# Patient Record
Sex: Female | Born: 2000 | Race: Black or African American | Hispanic: No | Marital: Single | Smoking: Never smoker
Health system: Southern US, Community
[De-identification: ages and names within clinical notes are randomized; demographics above are authoritative.]

## PROBLEM LIST (undated history)

## (undated) DIAGNOSIS — Z789 Other specified health status: Secondary | ICD-10-CM

## (undated) HISTORY — PX: NO PAST SURGERIES: SHX2092

---
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
## 2019-04-26 DIAGNOSIS — Z3400 Encounter for supervision of normal first pregnancy, unspecified trimester: Secondary | ICD-10-CM | POA: Insufficient documentation

## 2019-04-26 HISTORY — DX: Encounter for supervision of normal first pregnancy, unspecified trimester: Z34.00

## 2019-04-26 LAB — OB RESULTS CONSOLE RUBELLA ANTIBODY, IGM: Rubella: IMMUNE

## 2019-04-26 LAB — OB RESULTS CONSOLE ANTIBODY SCREEN: Antibody Screen: NEGATIVE

## 2019-04-26 LAB — OB RESULTS CONSOLE GC/CHLAMYDIA
Chlamydia: NEGATIVE
Gonorrhea: NEGATIVE

## 2019-04-26 LAB — OB RESULTS CONSOLE ABO/RH: RH Type: POSITIVE

## 2019-04-26 LAB — OB RESULTS CONSOLE HEPATITIS B SURFACE ANTIGEN: Hepatitis B Surface Ag: NEGATIVE

## 2019-04-26 LAB — OB RESULTS CONSOLE HIV ANTIBODY (ROUTINE TESTING): HIV: NONREACTIVE

## 2019-04-26 LAB — OB RESULTS CONSOLE RPR: RPR: NONREACTIVE

## 2019-08-29 DIAGNOSIS — Z3A26 26 weeks gestation of pregnancy: Secondary | ICD-10-CM | POA: Diagnosis not present

## 2019-08-29 DIAGNOSIS — U071 COVID-19: Secondary | ICD-10-CM | POA: Diagnosis not present

## 2019-08-29 DIAGNOSIS — O98512 Other viral diseases complicating pregnancy, second trimester: Secondary | ICD-10-CM | POA: Diagnosis not present

## 2019-09-19 DIAGNOSIS — Z3403 Encounter for supervision of normal first pregnancy, third trimester: Secondary | ICD-10-CM | POA: Diagnosis not present

## 2019-09-19 DIAGNOSIS — Z131 Encounter for screening for diabetes mellitus: Secondary | ICD-10-CM | POA: Diagnosis not present

## 2019-09-19 DIAGNOSIS — Z113 Encounter for screening for infections with a predominantly sexual mode of transmission: Secondary | ICD-10-CM | POA: Diagnosis not present

## 2019-10-24 DIAGNOSIS — B342 Coronavirus infection, unspecified: Secondary | ICD-10-CM | POA: Insufficient documentation

## 2019-10-26 ENCOUNTER — Inpatient Hospital Stay (HOSPITAL_COMMUNITY)
Admission: AD | Admit: 2019-10-26 | Discharge: 2019-10-26 | Disposition: A | Discharge status: Home / Self Care | Payer: Managed Care, Other (non HMO) | Attending: Obstetrics & Gynecology | Admitting: Obstetrics & Gynecology

## 2019-10-26 ENCOUNTER — Other Ambulatory Visit: Payer: Self-pay

## 2019-10-26 ENCOUNTER — Encounter (HOSPITAL_COMMUNITY): Payer: Self-pay | Admitting: *Deleted

## 2019-10-26 DIAGNOSIS — O26893 Other specified pregnancy related conditions, third trimester: Secondary | ICD-10-CM

## 2019-10-26 DIAGNOSIS — Z3A34 34 weeks gestation of pregnancy: Secondary | ICD-10-CM | POA: Insufficient documentation

## 2019-10-26 DIAGNOSIS — R110 Nausea: Secondary | ICD-10-CM | POA: Diagnosis not present

## 2019-10-26 DIAGNOSIS — R109 Unspecified abdominal pain: Secondary | ICD-10-CM | POA: Insufficient documentation

## 2019-10-26 HISTORY — DX: Other specified health status: Z78.9

## 2019-10-26 LAB — CBC WITH DIFFERENTIAL/PLATELET
Abs Immature Granulocytes: 0.08 10*3/uL — ABNORMAL HIGH (ref 0.00–0.07)
Basophils Absolute: 0 10*3/uL (ref 0.0–0.1)
Basophils Relative: 0 %
Eosinophils Absolute: 0.1 10*3/uL (ref 0.0–0.5)
Eosinophils Relative: 1 %
HCT: 32.5 % — ABNORMAL LOW (ref 36.0–46.0)
Hemoglobin: 11.3 g/dL — ABNORMAL LOW (ref 12.0–15.0)
Immature Granulocytes: 1 %
Lymphocytes Relative: 20 %
Lymphs Abs: 1.9 10*3/uL (ref 0.7–4.0)
MCH: 31 pg (ref 26.0–34.0)
MCHC: 34.8 g/dL (ref 30.0–36.0)
MCV: 89.3 fL (ref 80.0–100.0)
Monocytes Absolute: 0.6 10*3/uL (ref 0.1–1.0)
Monocytes Relative: 6 %
Neutro Abs: 6.8 10*3/uL (ref 1.7–7.7)
Neutrophils Relative %: 72 %
Platelets: 207 10*3/uL (ref 150–400)
RBC: 3.64 MIL/uL — ABNORMAL LOW (ref 3.87–5.11)
RDW: 14.1 % (ref 11.5–15.5)
WBC: 9.5 10*3/uL (ref 4.0–10.5)
nRBC: 0 % (ref 0.0–0.2)

## 2019-10-26 LAB — COMPREHENSIVE METABOLIC PANEL
ALT: 10 U/L (ref 0–44)
AST: 15 U/L (ref 15–41)
Albumin: 2.7 g/dL — ABNORMAL LOW (ref 3.5–5.0)
Alkaline Phosphatase: 102 U/L (ref 38–126)
Anion gap: 7 (ref 5–15)
BUN: <5 mg/dL — ABNORMAL LOW (ref 6–20)
CO2: 25 mmol/L (ref 22–32)
Calcium: 8.8 mg/dL — ABNORMAL LOW (ref 8.9–10.3)
Chloride: 106 mmol/L (ref 98–111)
Creatinine, Ser: 0.75 mg/dL (ref 0.44–1.00)
GFR calc Af Amer: >60 mL/min (ref >60)
GFR calc non Af Amer: >60 mL/min (ref >60)
Glucose, Bld: 97 mg/dL (ref 70–99)
Potassium: 4 mmol/L (ref 3.5–5.1)
Sodium: 138 mmol/L (ref 135–145)
Total Bilirubin: 0.5 mg/dL (ref 0.3–1.2)
Total Protein: 5.7 g/dL — ABNORMAL LOW (ref 6.5–8.1)

## 2019-10-26 LAB — URINALYSIS, ROUTINE W REFLEX MICROSCOPIC
Bilirubin Urine: NEGATIVE
Glucose, UA: NEGATIVE mg/dL
Hgb urine dipstick: NEGATIVE
Ketones, ur: NEGATIVE mg/dL
Nitrite: NEGATIVE
Protein, ur: NEGATIVE mg/dL
Specific Gravity, Urine: 1.01 (ref 1.005–1.030)
pH: 8 (ref 5.0–8.0)

## 2019-10-26 LAB — AMYLASE: Amylase: 86 U/L (ref 28–100)

## 2019-10-26 LAB — LIPASE, BLOOD: Lipase: 20 U/L (ref 11–51)

## 2019-10-26 NOTE — MAU Note (Signed)
Pt reports s stabbing pain in her mid right abd that started around 1630. Pain is not as intense now but has had  afew times it got stronger since. Denies any vag bleeding or discharge. Reports decreased fetal movement today as well 34 weeks. Just moved from [place].

## 2019-10-26 NOTE — Discharge Instructions (Signed)
[facility]    [facility]       Phone: [phone]  [facility]   Phone: [phone]  [facility] [place]  Phone: [phone]  [facility]  Phone: [phone]  [facility] [place]  Phone: [phone]  [facility]   Phone: [phone]  [facility]       Phone: [phone]  [facility]    Phone: [phone]   [facility]    Phone: [phone]  [facility]    Phone: [phone]  [facility]    Phone: [phone]  [facility]       Phone: [phone]   [facility]  Phone: [phone]  [facility]    Phone: [phone]  [facility]   Phone: [phone]  [facility]      Phone: [phone]  [facility]    Phone: [phone]    Abdominal Pain During Pregnancy  Abdominal pain is common during pregnancy, and has many possible causes. Some causes are more serious than others, and sometimes the cause is not known. Abdominal pain can be a sign that labor is starting. It can also be caused by normal growth and stretching of muscles and ligaments during pregnancy. Always tell your health care provider if you have any abdominal pain. Follow these instructions at home:  Do not have sex or put anything in your vagina until your pain goes away completely.  Get plenty of rest until your pain improves.  Drink enough fluid to keep your urine pale yellow.  Take over-the-counter and prescription medicines only as told by your health care provider.  Keep all follow-up visits as told by your health care provider. This is  important. Contact a health care provider if:  Your pain continues or gets worse after resting.  You have lower abdominal pain that: ? Comes and goes at regular intervals. ? Spreads to your back. ? Is similar to menstrual cramps.  You have pain or burning when you urinate. Get help right away if:  You have a fever or chills.  You have vaginal bleeding.  You are leaking fluid from your vagina.  You are passing tissue from your vagina.  You have vomiting or diarrhea that lasts for more than 24 hours.  Your baby is moving less than usual.  You feel very weak or faint.  You have shortness of breath.  You develop severe pain in your upper abdomen. Summary  Abdominal pain is common during pregnancy, and has many possible causes.  If you experience abdominal pain during pregnancy, tell your health care provider right away.  Follow your health care provider's home care instructions and keep all follow-up visits as directed. This information is not intended to replace advice given to you by your health care provider. Make sure you discuss any questions you have with your health care provider. Document Released: 11/23/2005 Document Revised: 03/13/2019 Document Reviewed: 02/25/2017 Elsevier Patient Education  2020 Elsevier Inc.    [employer] CorporationBraxton Hicks Contractions Contractions of the uterus can occur throughout pregnancy, but they are not always a sign that you are in labor. You may have practice contractions called Braxton Hicks contractions. These false labor contractions are sometimes confused with true labor. What are Deborah PeltonBraxton Hicks contractions?  Braxton Hicks contractions are tightening movements that occur in the muscles of the uterus before labor. Unlike true labor contractions, these contractions do not result in opening (dilation) and thinning of the cervix. Toward the end of pregnancy (32-34 weeks), Braxton Hicks contractions can happen more often and may become stronger. These  contractions are sometimes difficult to tell apart from true labor because they can be very uncomfortable. You should not feel embarrassed if you go to the hospital with false labor. Sometimes, the only way to tell if you are in true labor is for your health care provider to look for changes in the cervix. The health care provider will do a physical exam and may monitor your contractions. If you are not in true labor, the exam should show that your cervix is not dilating and your water has not broken. If there are no other health problems associated with your pregnancy, it is completely safe for you to be sent home with false labor. You may continue to have Braxton Hicks contractions until you go into true labor. How to tell the difference between true labor and false labor True labor  Contractions last 30-70 seconds.  Contractions become very regular.  Discomfort is usually felt in the top of the uterus, and it spreads to the lower abdomen and low back.  Contractions do not go away with walking.  Contractions usually become more intense and increase in frequency.  The cervix dilates and gets thinner. False labor  Contractions are usually shorter and not as strong as true labor contractions.  Contractions are usually irregular.  Contractions are often felt in the front of the lower abdomen and in the groin.  Contractions may go away when you walk around or change positions while lying down.  Contractions get weaker and are shorter-lasting as time goes on.  The cervix usually does not dilate or become thin. Follow these instructions at home:   Take over-the-counter and prescription medicines only as told by your health care provider.  Keep up with your usual exercises and follow other instructions from your health care provider.  Eat and drink lightly if you think you are going into labor.  If Braxton Hicks contractions are making you uncomfortable: ? Change your position from  lying down or resting to walking, or change from walking to resting. ? Sit and rest in a tub of warm water. ? Drink enough fluid to keep your urine pale yellow. Dehydration may cause these contractions. ? Do slow and deep breathing several times an hour.  Keep all follow-up prenatal visits as told by your health care provider. This is important. Contact a health care provider if:  You have a fever.  You have continuous pain in your abdomen. Get help right away if:  Your contractions become stronger, more regular, and closer together.  You have fluid leaking or gushing from your vagina.  You pass blood-tinged mucus (bloody show).  You have bleeding from your vagina.  You have low back pain that you never had before.  You feel your babys head pushing down and causing pelvic pressure.  Your baby is not moving inside you as much as it used to. Summary  Contractions that occur before labor are called Braxton Hicks contractions, false labor, or practice contractions.  Braxton Hicks contractions are usually shorter, weaker, farther apart, and less regular than true labor contractions. True labor contractions usually become progressively stronger and regular, and they become more frequent.  Manage discomfort from [facility]  contractions by changing position, resting in a warm bath, drinking plenty of water, or practicing deep breathing. This information is not intended to replace advice given to you by your health care provider. Make sure you discuss any questions you have with your health care provider. Document Released: 04/08/2017 Document Revised: 11/05/2017 Document Reviewed: 04/08/2017 Elsevier Patient Education  2020 [employer].

## 2019-10-26 NOTE — MAU Provider Note (Addendum)
History     CSN: [id number]  Arrival date and time: 10/26/19 1708   None     Chief Complaint  Patient presents with  . Abdominal Pain   HPI   Deborah Tate is a 18 y.o. G1P0 @ [redacted]w[redacted]d, here for stabbing right-sided abdominal pain that started this afternoon at around 16:30. Patient reports the pain initially started at RLQ and moved up to RUQ. Pain does not radiate and is constant at 3/10 severity but intensifies intermittently to 7/10 severity. It worsens with standing up and alleviated when she bends over.   Patient also reports associated mild nausea without vomiting. Patient also reports decreased fetal movement today and has felt about 4-5 times today in total.  Last intercourse two days ago. Last bowel movement yesterday. Patient denies any recent trauma.   Patient denies : fever, chest pain, edema, urinary symptoms, diarrhea, constipation, vaginal bleeding, vaginal discharge   Past Medical History:  Diagnosis Date  . Medical history non-contributory     Past Surgical History:  Procedure Laterality Date  . NO PAST SURGERIES      No family history on file.  Social History   Tobacco Use  . Smoking status: Not on file  Substance Use Topics  . Alcohol use: Not on file  . Drug use: Not on file    Allergies: No Known Allergies  No medications prior to admission.    Review of Systems  Constitutional: Negative for fatigue and fever.  Cardiovascular: Negative.   Gastrointestinal: Positive for abdominal pain (RUQ) and nausea. Negative for abdominal distention, constipation, diarrhea and vomiting.  Genitourinary: Negative for dysuria, flank pain, pelvic pain, vaginal bleeding, vaginal discharge and vaginal pain.  Neurological: Negative for dizziness.   Physical Exam   Blood pressure 104/60, pulse 85, temperature 98.3 F (36.8 C), temperature source Oral, resp. rate 18, height 5\' 2"  (1.575 m), weight 72.6 kg.  Physical Exam  Nursing note and vitals  reviewed. Constitutional: She is oriented to person, place, and time. She appears well-developed and well-nourished. No distress.  Cardiovascular: Normal rate.  Respiratory: Effort normal.  GI: Soft.  Neurological: She is alert and oriented to person, place, and time.  Skin: Skin is warm and dry. No erythema.  Psychiatric: She has a normal mood and affect.   FHT : 150bpm baseline, moderate variability, +accel, -decel  TOCO : no activity today  MAU Course   Results for orders placed or performed during the hospital encounter of 10/26/19 (from the past 24 hour(s))  Urinalysis, Routine w reflex microscopic     Status: Abnormal   Collection Time: 10/26/19  5:59 PM  Result Value Ref Range   Color, Urine YELLOW YELLOW   APPearance CLEAR CLEAR   Specific Gravity, Urine 1.010 1.005 - 1.030   pH 8.0 5.0 - 8.0   Glucose, UA NEGATIVE NEGATIVE mg/dL   Hgb urine dipstick NEGATIVE NEGATIVE   Bilirubin Urine NEGATIVE NEGATIVE   Ketones, ur NEGATIVE NEGATIVE mg/dL   Protein, ur NEGATIVE NEGATIVE mg/dL   Nitrite NEGATIVE NEGATIVE   Leukocytes,Ua SMALL (A) NEGATIVE   RBC / HPF 0-5 0 - 5 RBC/hpf   WBC, UA 0-5 0 - 5 WBC/hpf   Bacteria, UA RARE (A) NONE SEEN   Squamous Epithelial / LPF 0-5 0 - 5   Mucus PRESENT   CBC with Differential     Status: Abnormal   Collection Time: 10/26/19  7:01 PM  Result Value Ref Range   WBC 9.5 4.0 - 10.5 K/uL  RBC 3.64 (L) 3.87 - 5.11 MIL/uL   Hemoglobin 11.3 (L) 12.0 - 15.0 g/dL   HCT 32.5 (L) 36.0 - 46.0 %   MCV 89.3 80.0 - 100.0 fL   MCH 31.0 26.0 - 34.0 pg   MCHC 34.8 30.0 - 36.0 g/dL   RDW 14.1 11.5 - 15.5 %   Platelets 207 150 - 400 K/uL   nRBC 0.0 0.0 - 0.2 %   Neutrophils Relative % 72 %   Neutro Abs 6.8 1.7 - 7.7 K/uL   Lymphocytes Relative 20 %   Lymphs Abs 1.9 0.7 - 4.0 K/uL   Monocytes Relative 6 %   Monocytes Absolute 0.6 0.1 - 1.0 K/uL   Eosinophils Relative 1 %   Eosinophils Absolute 0.1 0.0 - 0.5 K/uL   Basophils Relative 0 %    Basophils Absolute 0.0 0.0 - 0.1 K/uL   Immature Granulocytes 1 %   Abs Immature Granulocytes 0.08 (H) 0.00 - 0.07 K/uL  CMET STAT     Status: Abnormal   Collection Time: 10/26/19  7:01 PM  Result Value Ref Range   Sodium 138 135 - 145 mmol/L   Potassium 4.0 3.5 - 5.1 mmol/L   Chloride 106 98 - 111 mmol/L   CO2 25 22 - 32 mmol/L   Glucose, Bld 97 70 - 99 mg/dL   BUN <5 (L) 6 - 20 mg/dL   Creatinine, Ser 0.75 0.44 - 1.00 mg/dL   Calcium 8.8 (L) 8.9 - 10.3 mg/dL   Total Protein 5.7 (L) 6.5 - 8.1 g/dL   Albumin 2.7 (L) 3.5 - 5.0 g/dL   AST 15 15 - 41 U/L   ALT 10 0 - 44 U/L   Alkaline Phosphatase 102 38 - 126 U/L   Total Bilirubin 0.5 0.3 - 1.2 mg/dL   GFR calc non Af Amer >60 >60 mL/min   GFR calc Af Amer >60 >60 mL/min   Anion gap 7 5 - 15  Lipase STAT     Status: None   Collection Time: 10/26/19  7:01 PM  Result Value Ref Range   Lipase 20 11 - 51 U/L  Amylase     Status: None   Collection Time: 10/26/19  7:01 PM  Result Value Ref Range   Amylase 86 28 - 100 U/L   MDM  Assessment and Plan   1. Abdominal pain during pregnancy in third trimester - WBC, lipase, amylase, UA, and CMET WNL.  - Possibly braxton hicks vs. Musculoskeletal especially as the pain is positional per patient. Educational counseling given regarding symptoms and signs of Braxton Hicks vs. true labor contractions.    2. Encouraged patient to establish prenatal care nearby - Patient moved to [place] from [place] at the end of October. She has been driving back to [facility] for her prenatal care since and last care visit was this two days ago. Next [place] office visit scheduled for Dec 3rd. - Encourage the patient to keep the Dec 3rd visit and a list of OB outpatient offices in [place] given.     Hessie Dibble 10/26/2019, 8:30 PM   I was performed the exam and agree with above. No evidence of PTL, appendicitis, cholecystitis, pancreatitis or other emergent condition.  Tamala Julian, [place],  [place] 10/26/2019 11:50 PM

## 2019-11-15 ENCOUNTER — Telehealth: Payer: Self-pay | Admitting: *Deleted

## 2019-11-15 NOTE — Telephone Encounter (Signed)
Left patient a message to call or [facility] message the answers to screening questions prior to appointment on 11/16/19 at 1:15 PM with an 1:00 PM arrival.

## 2019-11-16 ENCOUNTER — Other Ambulatory Visit: Payer: Self-pay

## 2019-11-16 ENCOUNTER — Inpatient Hospital Stay (HOSPITAL_COMMUNITY)
Admission: AD | Admit: 2019-11-16 | Discharge: 2019-11-16 | Disposition: A | Discharge status: Home / Self Care | Payer: Managed Care, Other (non HMO) | Attending: Obstetrics and Gynecology | Admitting: Obstetrics and Gynecology

## 2019-11-16 ENCOUNTER — Other Ambulatory Visit (HOSPITAL_COMMUNITY)
Admission: RE | Admit: 2019-11-16 | Discharge: 2019-11-16 | Disposition: A | Discharge status: Home / Self Care | Payer: Managed Care, Other (non HMO) | Source: Ambulatory Visit | Attending: Obstetrics & Gynecology | Admitting: Obstetrics & Gynecology

## 2019-11-16 ENCOUNTER — Encounter (HOSPITAL_COMMUNITY): Payer: Self-pay | Admitting: Obstetrics and Gynecology

## 2019-11-16 ENCOUNTER — Encounter: Payer: Self-pay | Admitting: Obstetrics & Gynecology

## 2019-11-16 ENCOUNTER — Ambulatory Visit (INDEPENDENT_AMBULATORY_CARE_PROVIDER_SITE_OTHER): Payer: Managed Care, Other (non HMO) | Admitting: Obstetrics & Gynecology

## 2019-11-16 VITALS — BP 99/66 | HR 95 | Temp 98.2°F | Wt 162.0 lb

## 2019-11-16 DIAGNOSIS — Z3A37 37 weeks gestation of pregnancy: Secondary | ICD-10-CM

## 2019-11-16 DIAGNOSIS — Z0371 Encounter for suspected problem with amniotic cavity and membrane ruled out: Secondary | ICD-10-CM | POA: Diagnosis not present

## 2019-11-16 DIAGNOSIS — O26843 Uterine size-date discrepancy, third trimester: Secondary | ICD-10-CM

## 2019-11-16 DIAGNOSIS — Z23 Encounter for immunization: Secondary | ICD-10-CM

## 2019-11-16 DIAGNOSIS — O368130 Decreased fetal movements, third trimester, not applicable or unspecified: Secondary | ICD-10-CM | POA: Diagnosis not present

## 2019-11-16 DIAGNOSIS — Z87891 Personal history of nicotine dependence: Secondary | ICD-10-CM | POA: Diagnosis not present

## 2019-11-16 DIAGNOSIS — Z3400 Encounter for supervision of normal first pregnancy, unspecified trimester: Secondary | ICD-10-CM

## 2019-11-16 DIAGNOSIS — O288 Other abnormal findings on antenatal screening of mother: Secondary | ICD-10-CM | POA: Insufficient documentation

## 2019-11-16 LAB — AMNISURE RUPTURE OF MEMBRANE (ROM) NOT AT ARMC: Amnisure ROM: NEGATIVE

## 2019-11-16 LAB — URINALYSIS, ROUTINE W REFLEX MICROSCOPIC
Bilirubin Urine: NEGATIVE
Glucose, UA: NEGATIVE mg/dL
Hgb urine dipstick: NEGATIVE
Ketones, ur: NEGATIVE mg/dL
Nitrite: NEGATIVE
Protein, ur: NEGATIVE mg/dL
Specific Gravity, Urine: 1.004 — ABNORMAL LOW (ref 1.005–1.030)
pH: 7 (ref 5.0–8.0)

## 2019-11-16 LAB — POCT FERN TEST: POCT Fern Test: NEGATIVE

## 2019-11-16 LAB — OB RESULTS CONSOLE GBS: GBS: NEGATIVE

## 2019-11-16 NOTE — Discharge Instructions (Signed)
Oligohydramnios Oligohydramnios is a condition in which there is not enough fluid in the sac (amniotic sac) that surrounds your unborn baby (fetus) in the uterus. The amniotic sac contains fluid (amniotic fluid) that:  Protects your baby from injury (trauma) and infections.  Helps your baby move freely inside the uterus.  Helps your baby's lungs, kidneys, and digestive system to develop. This condition can interfere with your baby's normal prenatal growth and development. It can happen any time during pregnancy but is most common during the last 3 months (third trimester). Oligohydramnios is most likely to cause serious problems when it occurs early in pregnancy. Some problems that can result from this condition include:  Early (premature) birth.  Birth defects.  Limited (restricted) fetal growth.  Decreased oxygen flow to the fetus due to pressure on the umbilical cord.  Pregnancy loss (miscarriage).  Stillbirth. What are the causes? This condition may be caused by:  A leak or tear in the amniotic sac.  A problem with the organ that nourishes the baby in the uterus (placenta), such as failure of the placenta to provide enough blood, fluid, and nutrients to the baby.  Having identical twins who share the same placenta.  A fetal birth defect. This is usually an abnormality in the fetal kidneys or urinary tract.  A pregnancy that goes 2 weeks or more past the due date.  A condition that the mother has, such as: ? A lack of fluids in the body (dehydration). ? High blood pressure. ? Diabetes. ? Reactions to certain medicines, such as ibuprofen or blood pressure medicines (ACE inhibitors). ? Systemic lupus. In some cases, the cause is not known. What increases the risk? This condition is more likely to develop if you:  Become dehydrated.  Have high blood pressure.  Take NSAIDs or ACE inhibitors.  Have diabetes or lupus.  Have poor prenatal care.  Have a clotting  disorder. What are the signs or symptoms? In most cases, there are no symptoms of oligohydramnios. If you do have symptoms, they may include:  Fluid leaking from the vagina.  Having a uterus that is smaller than normal.  Feeling less movement of your baby in your uterus. How is this diagnosed? This condition may be diagnosed by measuring the amount of amniotic fluid in your amniotic sac using the amniotic fluid index (AFI). The AFI is a measurement that is done during a prenatal ultrasound test that uses painless, harmless sound waves to create an image of your uterus and your baby. This prenatal ultrasound may be done to:  Measure the amniotic fluid level.  Check your baby's kidneys and your baby's growth.  Evaluate the placenta. You may also have other tests to find the cause of oligohydramnios. How is this treated? Treatment for this condition depends on how low your amniotic fluid level is, how far along you are in your pregnancy, and your overall health. Treatment may include:  Having your health care provider monitor your condition more closely than usual. You may have more frequent appointments and more AFI ultrasound measurements.  Increasing the amount of fluid in your body. This may be done by having you drink more fluids or by giving you fluids through an IV that is inserted into one of your veins.  Injecting fluid into your amniotic sac during delivery (amnioinfusion).  Having your baby delivered early, if you are close to your due date. Follow these instructions at home: Lifestyle  Do not drink alcohol. No amount of alcohol is  safe during pregnancy.  Do not use any products that contain nicotine or tobacco, such as cigarettes, e-cigarettes, and chewing tobacco. If you need help quitting, ask your health care provider.  Do not use any illegal drugs. These can harm your developing baby or cause a miscarriage. General instructions  Take over-the-counter and  prescription medicines only as told by your health care provider.  Follow instructions from your health care provider about physical activity and rest. Your health care provider may recommend that you stay in bed (be on bed rest).  Follow instructions from your health care provider about eating or drinking restrictions.  Eat healthy foods, including a balance of fruits and vegetables, lean proteins, whole grains, and low-fat or nonfat dairy products.  Drink enough fluid to keep your urine pale yellow.  Keep all prenatal care appointments with your health care provider. This is important. Contact a health care provider if:  You notice that your baby seems to be moving less than usual. Get help right away if:  You have fluid leaking from your vagina.  You start to have labor pains (contractions). This may feel like a sense of tightening in your lower abdomen.  You have a fever. Summary  Oligohydramnios is a condition in which there is not enough fluid in the amniotic sac that surrounds your unborn baby in the uterus. It can interfere with your baby's normal growth and development.  This condition is most common in the last 3 months of pregnancy but can occur at any time. If it occurs early in pregnancy, oligohydramnios can lead to serious problems.  Do not drink alcohol or use nicotine, tobacco, or illegal drugs.  Keep your regular prenatal appointments, eat healthy, and drink enough fluid to keep your urine pale yellow.  Contact your health care provider if you notice that your baby seems to be moving less than usual. Get help right away if you have fluid leaking from your vagina, you sense a tightening in your lower abdomen, or you have a fever. This information is not intended to replace advice given to you by your health care provider. Make sure you discuss any questions you have with your health care provider. Document Released: 03/10/2011 Document Revised: 05/04/2018 Document  Reviewed: 05/04/2018 Elsevier Patient Education  2020 [employer].

## 2019-11-16 NOTE — MAU Note (Signed)
.   Deborah Tate is a 18 y.o. at [redacted]w[redacted]d here in MAU reporting: that she was sent over for monitoring after U/S in office. Reports upper abdominal pain at times. +FM  Onset of complaint: in office today Pain score: 4 Vitals:   11/16/19 1523  BP: (!) 112/58  Pulse: 82  Resp: 18  Temp: 97.9 F (36.6 C)  SpO2: 100%     FHT:166 Lab orders placed from triage:UA

## 2019-11-16 NOTE — MAU Provider Note (Addendum)
History      CSN: [id number]  Arrival date and time: 11/16/19 1507   First Provider Initiated Contact with Patient 11/16/19 1617      Chief Complaint  Patient presents with  . monitoring   Deborah Tate is a 18 y.o. G1P0 at [redacted]w[redacted]d who was sent to MAU from the office for rule out rupture of membranes. She had a size-date discrepancy. AFI was 8.1, grade 4 placenta. Reports continuous clear vaginal discharge x1 week with increase in amount of discharge noted in the past 2 days. Patient denies vaginal bleeding, abdominal pain, decreased fetal movement.   OB History    Gravida  1   Para      Term      Preterm      AB      Living        SAB      TAB      Ectopic      Multiple      Live Births              Past Medical History:  Diagnosis Date  . Medical history non-contributory     Past Surgical History:  Procedure Laterality Date  . NO PAST SURGERIES      Family History  Problem Relation Age of Onset  . Gestational diabetes Mother   . Diabetes Maternal Grandmother   . Lung disease Maternal Grandfather     Social History   Tobacco Use  . Smoking status: Former Smoker    Quit date: 11/15/2018    Years since quitting: 1.0  . Smokeless tobacco: Never Used  Substance Use Topics  . Alcohol use: Never  . Drug use: Not Currently    Types: Marijuana    Allergies: No Known Allergies  Medications Prior to Admission  Medication Sig Dispense Refill Last Dose  . Prenatal Vit-Fe Fumarate-FA (PNV FOLIC ACID + IRON) 27-1 MG TABS Take by mouth.   11/15/2019 at Unknown time    Review of Systems  Constitutional: Negative for chills and fever.  Gastrointestinal: Negative for abdominal distention and abdominal pain.  Genitourinary: Positive for vaginal discharge. Negative for pelvic pain, vaginal bleeding and vaginal pain.  All other systems reviewed and are negative.  Physical Exam   Blood pressure (!) 107/57, pulse 89, temperature 98.3 F (36.8 C),  resp. rate 20, SpO2 100 %.  Physical Exam  Constitutional: She is oriented to person, place, and time. She appears well-developed and well-nourished.  Respiratory: Effort normal.  GI: Soft.  Genitourinary:    Vaginal discharge present.     Genitourinary Comments: Pelvic Exam: Normal appearing external female genitalia with clear vaginal discharge noted externally, no abnormal discharge or pooling of fluid during speculum exam. Normal appearing cervix.   Neurological: She is alert and oriented to person, place, and time.  Skin: Skin is warm and dry.  Psychiatric: She has a normal mood and affect. Her behavior is normal.    MAU Course  Procedures  MDM Patient has scant clear vaginal discharge present on external exam without pooling, negative Fern, negative Amnisure. No evidence of ROM. Consulted with Dr. Macon Large regarding AFI and size date discrepancy. Was advised to schedule for an outpatient growth ultrasound and BPP for tomorrow with MFM.  Assessment and Plan   1. AFI (amniotic fluid index) borderline low   2. Uterine size date discrepancy pregnancy, third trimester   3. No leakage of amniotic fluid into vagina    - Growth ultrasound and  BPP scheduled with MFM for tomorrow - Advised patient to call office if have not heard regarding appointment by 9:00am on 11/17/19  - Discussed normal vaginal discharge of pregnancy - Educated on low AFI and provided handout on oligohydramnios - Labor and emergent precautions given - Advised to return as needed to MAU and keep scheduled prenatal visit appointments  [name] 11/16/2019, 7:10 PM   I was present for the exam, verified findings and agree with above.  Tamala Julian, [place], [place] 11/16/2019 7:30 PM

## 2019-11-16 NOTE — Addendum Note (Signed)
Addended by: Lyndal Rainbow on: 11/16/2019 01:47 PM   Modules accepted: Orders

## 2019-11-16 NOTE — Progress Notes (Addendum)
  Subjective:    Deborah Tate is being seen today for her first obstetrical visit.  This is not a planned pregnancy. She is at [redacted]w[redacted]d gestation. Her obstetrical history is significant for teen pregnancy. Relationship with FOB: significant other, living together. Patient does intend to breast feed. Pregnancy history fully reviewed. She tells me that she has had prenatal care in [place]. She moved here in October. She has not had a flu vaccine or GBS cultures. Patient reports decreased fetal movement.  Review of Systems:   Review of Systems  She is a sophomore at [employer].  Objective:     BP 99/66   Pulse 95   Temp 98.2 F (36.8 C)   Wt 162 lb (73.5 kg)   BMI 29.63 kg/m  Physical Exam  Exam Breathing, conversing, and ambulating normally Well nourished, well hydrated Black female, no apparent distress Heart-rrr Lungs- CTAB Abd- gravid, benign FH- 30 ([redacted] weeks EGA) Cervix- closed Relatively narrow pubic arch   Assessment:    Pregnancy: G1P0 Patient Active Problem List   Diagnosis Date Noted  . Coronavirus infection 10/24/2019  . Supervision of normal first pregnancy, antepartum 04/26/2019       Plan:     Initial labs drawn. Prenatal vitamins. Problem list reviewed and updated. Her AFI here is 8.1 and the placenta is Grade 4. I spoke with Dr. Annamaria Boots of MFM In person visit in a week Cervical cultures sent After her exam she gives a h/o some increase in vaginal discharge over the last few days. She will go to the MAU today  Emily Filbert 11/16/2019

## 2019-11-17 ENCOUNTER — Other Ambulatory Visit: Payer: Self-pay | Admitting: Obstetrics & Gynecology

## 2019-11-17 ENCOUNTER — Ambulatory Visit (HOSPITAL_COMMUNITY)
Admission: RE | Admit: 2019-11-17 | Discharge: 2019-11-17 | Disposition: A | Discharge status: Home / Self Care | Payer: Managed Care, Other (non HMO) | Source: Ambulatory Visit | Attending: Obstetrics & Gynecology | Admitting: Obstetrics & Gynecology

## 2019-11-17 DIAGNOSIS — O365930 Maternal care for other known or suspected poor fetal growth, third trimester, not applicable or unspecified: Secondary | ICD-10-CM | POA: Diagnosis not present

## 2019-11-17 DIAGNOSIS — Z363 Encounter for antenatal screening for malformations: Secondary | ICD-10-CM | POA: Diagnosis not present

## 2019-11-17 DIAGNOSIS — O368190 Decreased fetal movements, unspecified trimester, not applicable or unspecified: Secondary | ICD-10-CM

## 2019-11-17 DIAGNOSIS — Z3400 Encounter for supervision of normal first pregnancy, unspecified trimester: Secondary | ICD-10-CM

## 2019-11-17 DIAGNOSIS — O26843 Uterine size-date discrepancy, third trimester: Secondary | ICD-10-CM

## 2019-11-17 DIAGNOSIS — Z3A36 36 weeks gestation of pregnancy: Secondary | ICD-10-CM | POA: Diagnosis not present

## 2019-11-17 DIAGNOSIS — O368130 Decreased fetal movements, third trimester, not applicable or unspecified: Secondary | ICD-10-CM | POA: Diagnosis not present

## 2019-11-17 LAB — CERVICOVAGINAL ANCILLARY ONLY
Chlamydia: NEGATIVE
Comment: NEGATIVE
Comment: NORMAL
Neisseria Gonorrhea: NEGATIVE

## 2019-11-19 LAB — CULTURE, BETA STREP (GROUP B ONLY)
MICRO NUMBER:: 1185850
SPECIMEN QUALITY:: ADEQUATE

## 2019-11-20 ENCOUNTER — Other Ambulatory Visit: Payer: Self-pay

## 2019-11-20 ENCOUNTER — Encounter: Payer: Self-pay | Admitting: Advanced Practice Midwife

## 2019-11-20 ENCOUNTER — Inpatient Hospital Stay (HOSPITAL_COMMUNITY): Payer: Managed Care, Other (non HMO)

## 2019-11-20 ENCOUNTER — Inpatient Hospital Stay (EMERGENCY_DEPARTMENT_HOSPITAL)
Admission: AD | Admit: 2019-11-20 | Discharge: 2019-11-20 | Disposition: A | Discharge status: Home / Self Care | Payer: Managed Care, Other (non HMO) | Source: Home / Self Care | Attending: Emergency Medicine | Admitting: Emergency Medicine

## 2019-11-20 ENCOUNTER — Other Ambulatory Visit: Payer: Self-pay | Admitting: Obstetrics and Gynecology

## 2019-11-20 ENCOUNTER — Encounter (HOSPITAL_COMMUNITY): Payer: Self-pay | Admitting: Obstetrics & Gynecology

## 2019-11-20 DIAGNOSIS — O26893 Other specified pregnancy related conditions, third trimester: Secondary | ICD-10-CM

## 2019-11-20 DIAGNOSIS — R0789 Other chest pain: Secondary | ICD-10-CM | POA: Insufficient documentation

## 2019-11-20 DIAGNOSIS — U071 COVID-19: Secondary | ICD-10-CM | POA: Insufficient documentation

## 2019-11-20 DIAGNOSIS — O365990 Maternal care for other known or suspected poor fetal growth, unspecified trimester, not applicable or unspecified: Secondary | ICD-10-CM | POA: Insufficient documentation

## 2019-11-20 DIAGNOSIS — O99891 Other specified diseases and conditions complicating pregnancy: Secondary | ICD-10-CM | POA: Diagnosis not present

## 2019-11-20 DIAGNOSIS — Z87891 Personal history of nicotine dependence: Secondary | ICD-10-CM | POA: Insufficient documentation

## 2019-11-20 DIAGNOSIS — Z3A37 37 weeks gestation of pregnancy: Secondary | ICD-10-CM | POA: Diagnosis not present

## 2019-11-20 DIAGNOSIS — O365930 Maternal care for other known or suspected poor fetal growth, third trimester, not applicable or unspecified: Secondary | ICD-10-CM | POA: Diagnosis not present

## 2019-11-20 DIAGNOSIS — Z20828 Contact with and (suspected) exposure to other viral communicable diseases: Secondary | ICD-10-CM | POA: Diagnosis not present

## 2019-11-20 HISTORY — DX: Maternal care for other known or suspected poor fetal growth, unspecified trimester, not applicable or unspecified: O36.5990

## 2019-11-20 LAB — BASIC METABOLIC PANEL
Anion gap: 9 (ref 5–15)
BUN: <5 mg/dL — ABNORMAL LOW (ref 6–20)
CO2: 23 mmol/L (ref 22–32)
Calcium: 8.7 mg/dL — ABNORMAL LOW (ref 8.9–10.3)
Chloride: 103 mmol/L (ref 98–111)
Creatinine, Ser: 0.71 mg/dL (ref 0.44–1.00)
GFR calc Af Amer: >60 mL/min (ref >60)
GFR calc non Af Amer: >60 mL/min (ref >60)
Glucose, Bld: 91 mg/dL (ref 70–99)
Potassium: 3.9 mmol/L (ref 3.5–5.1)
Sodium: 135 mmol/L (ref 135–145)

## 2019-11-20 LAB — CBC
HCT: 34 % — ABNORMAL LOW (ref 36.0–46.0)
Hemoglobin: 11.8 g/dL — ABNORMAL LOW (ref 12.0–15.0)
MCH: 31.3 pg (ref 26.0–34.0)
MCHC: 34.7 g/dL (ref 30.0–36.0)
MCV: 90.2 fL (ref 80.0–100.0)
Platelets: 236 10*3/uL (ref 150–400)
RBC: 3.77 MIL/uL — ABNORMAL LOW (ref 3.87–5.11)
RDW: 13.9 % (ref 11.5–15.5)
WBC: 8.6 10*3/uL (ref 4.0–10.5)
nRBC: 0 % (ref 0.0–0.2)

## 2019-11-20 LAB — I-STAT BETA HCG BLOOD, ED (MC, WL, AP ONLY): I-stat hCG, quantitative: >2000 m[IU]/mL — ABNORMAL HIGH (ref <5)

## 2019-11-20 LAB — TROPONIN I (HIGH SENSITIVITY): Troponin I (High Sensitivity): 3 ng/L (ref <18)

## 2019-11-20 MED ORDER — FAMOTIDINE 20 MG PO TABS
20.0000 mg | ORAL_TABLET | Freq: Once | ORAL | Status: AC
  Administered 2019-11-20: 17:00:00 20 mg via ORAL
  Filled 2019-11-20: qty 1

## 2019-11-20 MED ORDER — ACETAMINOPHEN 325 MG PO TABS
650.0000 mg | ORAL_TABLET | Freq: Once | ORAL | Status: AC
  Administered 2019-11-20: 650 mg via ORAL
  Filled 2019-11-20: qty 2

## 2019-11-20 MED ORDER — SODIUM CHLORIDE 0.9% FLUSH: 3.0000 mL | Freq: Once | INTRAVENOUS | Status: DC

## 2019-11-20 NOTE — MAU Note (Signed)
Patient presenting with c/o pain in her left upper chest area starting one and a half weeks ago, intermittent pain, describing as "pressure". Denies medicating for pain. +Fm, no VB, ROM at this time.

## 2019-11-20 NOTE — Discharge Instructions (Signed)
Your work-up today is reassuring, I suspect this pain may be related to reflux you can use Pepcid twice daily before breakfast and dinner.  Call your OB/GYN or return to the ED if symptoms are worsening.  Follow-up for your induction as planned in 2 days.

## 2019-11-20 NOTE — MAU Note (Signed)
Pt is c/o chest pain, tightness and shortness of breath for 2 weeks. Had OB appointment last week but did not tell them. Denies cntrx, VB or LOF. +FM.

## 2019-11-20 NOTE — MAU Provider Note (Signed)
History     CSN: [id number]  Arrival date and time: 11/20/19 1301   None     Chief Complaint  Patient presents with  . Chest Pain   HPI   [name] is a 18 y.o. female G1P0 @ 8129w5d here in MAU with chest pain. The pain is located in the center of her chest and today it is radiating up the left side of her neck to her left ear. The pain comes and goes. She has no SOB. She has no pregnancy complaints. No HA, or [place]. History of IUGR with this pregnancy. She was told by MFM she should deliver by 38 weeks. She has no significant cardiac history or significant cardiac family history.   OB History    Gravida  1   Para      Term      Preterm      AB      Living        SAB      TAB      Ectopic      Multiple      Live Births              Past Medical History:  Diagnosis Date  . Medical history non-contributory     Past Surgical History:  Procedure Laterality Date  . NO PAST SURGERIES      Family History  Problem Relation Age of Onset  . Gestational diabetes Mother   . Diabetes Maternal Grandmother   . Lung disease Maternal Grandfather     Social History   Tobacco Use  . Smoking status: Former Smoker    Quit date: 11/15/2018    Years since quitting: 1.0  . Smokeless tobacco: Never Used  Substance Use Topics  . Alcohol use: Never  . Drug use: Not Currently    Types: Marijuana    Allergies: No Known Allergies  Medications Prior to Admission  Medication Sig Dispense Refill Last Dose  . Prenatal Vit-Fe Fumarate-FA (PNV FOLIC ACID + IRON) 27-1 MG TABS Take by mouth.      Results for orders placed or performed during the hospital encounter of 11/20/19 (from the past 48 hour(s))  Basic metabolic panel     Status: Abnormal   Collection Time: 11/20/19  3:51 PM  Result Value Ref Range   Sodium 135 135 - 145 mmol/L   Potassium 3.9 3.5 - 5.1 mmol/L   Chloride 103 98 - 111 mmol/L   CO2 23 22 - 32 mmol/L   Glucose, Bld 91 70 - 99 mg/dL    BUN <5 (L) 6 - 20 mg/dL   Creatinine, Ser 6.960.71 0.44 - 1.00 mg/dL   Calcium 8.7 (L) 8.9 - 10.3 mg/dL   GFR calc non Af Amer >60 >60 mL/min   GFR calc Af Amer >60 >60 mL/min   Anion gap 9 5 - 15    Comment: Performed at [facility], [address] [place], [place] [id number]  CBC     Status: Abnormal   Collection Time: 11/20/19  3:51 PM  Result Value Ref Range   WBC 8.6 4.0 - 10.5 K/uL   RBC 3.77 (L) 3.87 - 5.11 MIL/uL   Hemoglobin 11.8 (L) 12.0 - 15.0 g/dL   HCT 41.334.0 (L) 24.436.0 - 01.046.0 %   MCV 90.2 80.0 - 100.0 fL   MCH 31.3 26.0 - 34.0 pg   MCHC 34.7 30.0 - 36.0 g/dL   RDW 27.213.9 53.611.5 - 64.415.5 %  Platelets 236 150 - 400 K/uL   nRBC 0.0 0.0 - 0.2 %    Comment: Performed at [facility], [address] [place], [place] [address]  Troponin I (High Sensitivity)     Status: None   Collection Time: 11/20/19  3:51 PM  Result Value Ref Range   Troponin I (High Sensitivity) 3 <18 ng/L    Comment: (NOTE) Elevated high sensitivity troponin I (hsTnI) values and significant  changes across serial measurements may suggest ACS but many other  chronic and acute conditions are known to elevate hsTnI results.  Refer to the Links section for chest pain algorithms and additional  guidance. Performed at [facility], [address], [place], [place] [address]   I-Stat beta hCG blood, ED     Status: Abnormal   Collection Time: 11/20/19  4:03 PM  Result Value Ref Range   I-stat hCG, quantitative >2,000.0 (H) <5 mIU/mL   Comment 3            Comment:   GEST. AGE      CONC.  (mIU/mL)   <=1 WEEK        5 - 50     2 WEEKS       50 - 500     3 WEEKS       100 - 10,000     4 WEEKS     1,000 - 30,000        FEMALE AND NON-PREGNANT FEMALE:     LESS THAN 5 mIU/mL     Review of Systems  Constitutional: Negative for fever.  Respiratory: Negative for shortness of breath.   Cardiovascular: Positive for chest pain.   Physical Exam   Blood pressure (!) 137/56, pulse 94, temperature  98.1 F (36.7 C), temperature source Oral, resp. rate 18, height 5\' 2"  (1.575 m), weight 75.2 kg, SpO2 100 %.   Patient Vitals for the past 24 hrs:  BP Temp Temp src Pulse Resp SpO2 Height Weight  11/20/19 1807 103/68 - - 91 16 100 % - -  11/20/19 1537 111/61 98.5 F (36.9 C) Oral 81 16 100 % - -  11/20/19 1459 115/65 - - 88 18 - - -  11/20/19 1457 - - - - - 100 % - -  11/20/19 1400 (!) 137/56 - - 94 - - - -  11/20/19 1350 115/66 98.1 F (36.7 C) Oral 82 18 100 % 5\' 2"  (1.575 m) 75.2 kg    Physical Exam  Constitutional: She is oriented to person, place, and time. She appears well-developed and well-nourished. No distress.  Cardiovascular: Normal rate and regular rhythm.  Respiratory: Breath sounds normal. No respiratory distress. She has no wheezes. She has no rales.  GI: Soft. She exhibits no distension. There is no abdominal tenderness.  Musculoskeletal:        General: Normal range of motion.  Neurological: She is alert and oriented to person, place, and time.  Skin: Skin is warm. She is not diaphoretic.  Psychiatric: Her behavior is normal.   Fetal Tracing: Baseline: 135 bpm Variability: Moderate  Accelerations: 15x15 Decelerations: None Toco: None  MAU Course  Procedures  None   MDM  EKG WnL  Discussed patient with Dr. 11/22/19 in who agree's patient should be evaluated in the ED d/t chest pain.  Per MFM patient should be delivered by 38 weeks. Reviewed recent which shows IUGR; confirmed induction at 38 weeks with Dr. Trude Mcburney. Induction scheduled for  Wednesday 12/16; Covid swab collected today in MAU. Patient is aware of the plan.  Patient is aware she should quarantine.   Assessment and Plan   A:  1. Atypical chest pain     P:  Transfer to ED for further evaluation of chest pain Induction scheduled on Wednesday. Covid swab collected today Labor precautions discussed.  Fetal kick counts reviewed.     Lezlie Lye, NP 11/20/2019 6:39 PM

## 2019-11-20 NOTE — ED Provider Notes (Signed)
[facility] EMERGENCY DEPARTMENT Provider Note   CSN: [id number] Arrival date & time: 11/20/19  1301     History Chief Complaint  Patient presents with  . Chest Pain    Deborah Tate is a 18 y.o. female.  Deborah Tate is a 18 y.o. female G1P0 at [redacted]w[redacted]d, otherwise healthy, who was sent from the MAU for further evaluation of chest pain.  She states that she has been having intermittent pains over the left side of her chest for the past week.  She states pains lasts anywhere from seconds to a few minutes, no she thinks she has had 2 minutes of constant pain.  She reports it as a pressure like sensation over the left side of her chest.  Pain occasionally radiates up to the top of her chest and neck.  She denies any associated shortness of breath, no dyspnea on exertion.  She denies any lower extremity swelling.  She has had some occasional palpitations.  Denies any lightheadedness or syncope.  No associated abdominal pains, did have some nausea with some episodes of pain.  She has not taken anything to treat the symptoms prior to arrival.  No previous history of cardiac issues.  She has not had any hypertension or preeclampsia during pregnancy.  She does report on intermittent headache across the front of her forehead which she states is mild and not associated with any vision changes, neck stiffness, numbness or weakness.  She has not had any associated fevers or cough and denies any known sick contacts.  No other aggravating or alleviating factors.  The history is provided by the patient and medical records.       Past Medical History:  Diagnosis Date  . Medical history non-contributory     Patient Active Problem List   Diagnosis Date Noted  . Pregnancy affected by fetal growth restriction 11/20/2019  . Coronavirus infection 10/24/2019  . Supervision of normal first pregnancy, antepartum 04/26/2019    Past Surgical History:  Procedure Laterality Date  . NO PAST  SURGERIES       OB History    Gravida  1   Para      Term      Preterm      AB      Living        SAB      TAB      Ectopic      Multiple      Live Births              Family History  Problem Relation Age of Onset  . Gestational diabetes Mother   . Diabetes Maternal Grandmother   . Lung disease Maternal Grandfather     Social History   Tobacco Use  . Smoking status: Former Smoker    Quit date: 11/15/2018    Years since quitting: 1.0  . Smokeless tobacco: Never Used  Substance Use Topics  . Alcohol use: Never  . Drug use: Not Currently    Types: Marijuana    Home Medications Prior to Admission medications   Medication Sig Start Date End Date Taking? Authorizing Provider  Prenatal Vit-Fe Fumarate-FA (PNV FOLIC ACID + IRON) 61-9 MG TABS Take 1 tablet by mouth daily.  04/03/19  Yes [provider]    Allergies    Patient has no known allergies.  Review of Systems   Review of Systems  Constitutional: Negative for chills and fever.  HENT: Negative.   Respiratory: Negative  for cough and shortness of breath.   Cardiovascular: Positive for chest pain.  Gastrointestinal: Negative for abdominal pain, diarrhea, nausea and vomiting.  Musculoskeletal: Negative for arthralgias and myalgias.  Skin: Negative for color change and rash.  Neurological: Negative for syncope and light-headedness.  All other systems reviewed and are negative.   Physical Exam Updated Vital Signs BP 111/61   Pulse 81   Temp 98.5 F (36.9 C) (Oral)   Resp 16   Ht  (1.575 m)   Wt 75.2 kg   SpO2 100%   BMI 30.31 kg/m   Physical Exam Vitals and nursing note reviewed.  Constitutional:      General: She is not in acute distress.    Appearance: She is well-developed. She is not diaphoretic.     Comments: Well-appearing pregnant female in no distress  HENT:     Head: Normocephalic and atraumatic.  Eyes:     General:        Right eye: No discharge.         Left eye: No discharge.     Pupils: Pupils are equal, round, and reactive to light.  Cardiovascular:     Rate and Rhythm: Normal rate and regular rhythm.     Pulses:          Radial pulses are 2+ on the right side and 2+ on the left side.     Heart sounds: Normal heart sounds. No murmur. No friction rub. No gallop.   Pulmonary:     Effort: Pulmonary effort is normal. No respiratory distress.     Breath sounds: Normal breath sounds. No wheezing or rales.     Comments: Respirations equal and unlabored, patient able to speak in full sentences, lungs clear to auscultation bilaterally Chest:     Chest wall: Tenderness present.     Comments: Mild tenderness of left costochondral margin, no skin changes or palpable deformity Abdominal:     General: There is no distension.     Palpations: Abdomen is soft.     Tenderness: There is no abdominal tenderness.     Comments: Gravid abdomen without tenderness  Musculoskeletal:        General: No deformity.     Cervical back: Neck supple.     Right lower leg: No edema.     Left lower leg: No edema.  Skin:    General: Skin is warm and dry.     Capillary Refill: Capillary refill takes less than 2 seconds.  Neurological:     Mental Status: She is alert.     Coordination: Coordination normal.     Comments: Speech is clear, able to follow commands Moves extremities without ataxia, coordination intact  Psychiatric:        Mood and Affect: Mood normal.        Behavior: Behavior normal.     ED Results / Procedures / Treatments   Labs (all labs ordered are listed, but only abnormal results are displayed) Labs Reviewed  BASIC METABOLIC PANEL - Abnormal; Notable for the following components:      Result Value   BUN <5 (*)    Calcium 8.7 (*)    All other components within normal limits  CBC - Abnormal; Notable for the following components:   RBC 3.77 (*)    Hemoglobin 11.8 (*)    HCT 34.0 (*)    All other components within normal limits    I-STAT BETA HCG BLOOD, ED (MC, [facility], AP ONLY) -  Abnormal; Notable for the following components:   I-stat hCG, quantitative >2,000.0 (*)    All other components within normal limits  SARS CORONAVIRUS 2 (TAT 6-24 HRS)  TROPONIN I (HIGH SENSITIVITY)  TROPONIN I (HIGH SENSITIVITY)    EKG EKG Interpretation  Date/Time:  Monday November 20 2019 15:32:57 EST Ventricular Rate:  84 PR Interval:  118 QRS Duration: 84 QT Interval:  354 QTC Calculation: 418 R Axis:   75 Text Interpretation: Normal sinus rhythm with sinus arrhythmia Nonspecific T wave abnormality Abnormal ECG No previous ECGs available Confirmed by Vanetta Mulders [phone]) on 11/20/2019 4:26:44 PM   Radiology No results found.  Procedures Procedures (including critical care time)  Medications Ordered in ED Medications  sodium chloride flush (NS) 0.9 % injection 3 mL (3 mLs Intravenous Not Given 11/20/19 1709)  famotidine (PEPCID) tablet 20 mg (20 mg Oral Given 11/20/19 1710)  acetaminophen (TYLENOL) tablet 650 mg (650 mg Oral Given 11/20/19 1710)    ED Course  I have reviewed the triage vital signs and the nursing notes.  Pertinent labs & imaging results that were available during my care of the patient were reviewed by me and considered in my medical decision making (see chart for details).  Clinical Course as of Nov 19 2201  Mon Nov 20, 2019  1650 Patient presents with intermittent left-sided chest pains over the past week described as pressure.  They last about 2 minutes at most and then seemed to resolve.  She has had some occasional palpitations.  She was sent over from MAU for further evaluation of the symptoms.  On arrival she has normal vitals and is well-appearing.   [KF]  1700 EKG without concerning ischemic changes  EKG 12-Lead [KF]  1700 Stable anemia of pregnancy, no leukocytosis  CBC(!) [KF]  1700 No electrolyte derangements and normal renal functions  Basic metabolic panel(!) [KF]  1700 Patient  refused chest x-ray, she does not want to potentially expose the baby to any radiation   [KF]  1705 No elevation in troponin, and patient is not having any active chest pain  Troponin I (High Sensitivity) [KF]  1740 Reevaluation symptoms have resolved with Pepcid and Tylenol, suspect this pain may have been related to reflux.  Vitals have remained normal throughout ED stay and patient would like to go home.   [KF]    Clinical Course User Index [KF] Legrand Rams   MDM Rules/Calculators/A&P  18 year old female who is [redacted]w[redacted]d scheduled for induction in 2 days, sent over from MAU for evaluation of intermittent left-sided chest pains.  She describes these of brief episodes of pressure that come and go without any associated shortness of breath.  No lower extremity edema.  Patient has not had any tachycardia, hypoxia or tachypnea, and aside from pregnancy has no other risk factors for PE and symptoms seem very atypical, had shared decision-making discussion with the patient and she would like to avoid any radiation.  Patient refused chest x-ray as well.  Her lab work EKG and troponin are all reassuring.  Suspect some of this may be related to GERD.  Story is not concerning for dissection and she has equal pulses.  Symptoms resolved with Tylenol and Pepcid.  She was monitored at MAU with good fetal movement and they had no further concerns regarding pregnancy.  Patient is stable for discharge home, and will follow up in 2 days as planned for scheduled induction.  Turn precautions discussed.  Case discussed with Dr. Deretha Emory who  was available to help guide patient's care and is in agreement with plan.  Final Clinical Impression(s) / ED Diagnoses Final diagnoses:  Atypical chest pain    Rx / DC Orders ED Discharge Orders    None       Legrand Rams 11/20/19 2213    Vanetta Mulders, MD 11/20/19 2317

## 2019-11-20 NOTE — Progress Notes (Signed)
COVID swab performed, pt to be induced Thursday, pt denies any symptoms. Loney Hering nurse ED called to inform that the NP was sending patient to ED for evaluation of chest pain, received by Dr. Peter Congo.

## 2019-11-21 ENCOUNTER — Telehealth (HOSPITAL_COMMUNITY): Payer: Self-pay | Admitting: *Deleted

## 2019-11-21 ENCOUNTER — Telehealth: Payer: Self-pay | Admitting: Advanced Practice Midwife

## 2019-11-21 ENCOUNTER — Other Ambulatory Visit: Payer: Self-pay | Admitting: Women's Health

## 2019-11-21 LAB — SARS CORONAVIRUS 2 (TAT 6-24 HRS): SARS Coronavirus 2: POSITIVE — AB

## 2019-11-21 NOTE — Telephone Encounter (Signed)
Preadmission screen  

## 2019-11-21 NOTE — Telephone Encounter (Signed)
Patient called to discuss impact of COVID + swab on visitor policy during her scheduled induction tomorrow. Patient already in communication with hospital staff and perfectly summarized expectations for remaining in room, limitation on support person, impact on ability to walk around unit and hospital campus.  Mallie Snooks, MSN, CNM Certified Nurse Midwife, [employer] for [employer], [employer] 11/21/19 1:57 PM

## 2019-11-22 ENCOUNTER — Encounter (HOSPITAL_COMMUNITY): Payer: Self-pay | Admitting: Obstetrics and Gynecology

## 2019-11-22 ENCOUNTER — Inpatient Hospital Stay (HOSPITAL_COMMUNITY): Payer: Managed Care, Other (non HMO)

## 2019-11-22 ENCOUNTER — Other Ambulatory Visit: Payer: Self-pay

## 2019-11-22 ENCOUNTER — Inpatient Hospital Stay (HOSPITAL_COMMUNITY)
Admission: AD | Admit: 2019-11-22 | Discharge: 2019-11-25 | DRG: 805 | Disposition: A | Discharge status: Home / Self Care | Payer: Managed Care, Other (non HMO) | Attending: Obstetrics and Gynecology | Admitting: Obstetrics and Gynecology

## 2019-11-22 DIAGNOSIS — U071 COVID-19: Secondary | ICD-10-CM | POA: Diagnosis not present

## 2019-11-22 DIAGNOSIS — O98513 Other viral diseases complicating pregnancy, third trimester: Secondary | ICD-10-CM | POA: Diagnosis not present

## 2019-11-22 DIAGNOSIS — O365990 Maternal care for other known or suspected poor fetal growth, unspecified trimester, not applicable or unspecified: Secondary | ICD-10-CM | POA: Diagnosis present

## 2019-11-22 DIAGNOSIS — O9852 Other viral diseases complicating childbirth: Secondary | ICD-10-CM | POA: Diagnosis present

## 2019-11-22 DIAGNOSIS — Z3A38 38 weeks gestation of pregnancy: Secondary | ICD-10-CM

## 2019-11-22 DIAGNOSIS — Z87891 Personal history of nicotine dependence: Secondary | ICD-10-CM

## 2019-11-22 DIAGNOSIS — B342 Coronavirus infection, unspecified: Secondary | ICD-10-CM | POA: Diagnosis present

## 2019-11-22 DIAGNOSIS — O365930 Maternal care for other known or suspected poor fetal growth, third trimester, not applicable or unspecified: Principal | ICD-10-CM | POA: Diagnosis present

## 2019-11-22 DIAGNOSIS — Z3400 Encounter for supervision of normal first pregnancy, unspecified trimester: Secondary | ICD-10-CM

## 2019-11-22 HISTORY — DX: Maternal care for other known or suspected poor fetal growth, unspecified trimester, not applicable or unspecified: O36.5990

## 2019-11-22 LAB — TYPE AND SCREEN
ABO/RH(D): B POS
Antibody Screen: NEGATIVE

## 2019-11-22 LAB — ABO/RH: ABO/RH(D): B POS

## 2019-11-22 LAB — RPR: RPR Ser Ql: NONREACTIVE

## 2019-11-22 LAB — CBC
HCT: 33.5 % — ABNORMAL LOW (ref 36.0–46.0)
Hemoglobin: 11.6 g/dL — ABNORMAL LOW (ref 12.0–15.0)
MCH: 31.4 pg (ref 26.0–34.0)
MCHC: 34.6 g/dL (ref 30.0–36.0)
MCV: 90.8 fL (ref 80.0–100.0)
Platelets: 223 10*3/uL (ref 150–400)
RBC: 3.69 MIL/uL — ABNORMAL LOW (ref 3.87–5.11)
RDW: 13.9 % (ref 11.5–15.5)
WBC: 8.6 10*3/uL (ref 4.0–10.5)
nRBC: 0 % (ref 0.0–0.2)

## 2019-11-22 MED ORDER — ACETAMINOPHEN 325 MG PO TABS: 650.0000 mg | ORAL_TABLET | ORAL | Status: DC | PRN

## 2019-11-22 MED ORDER — LACTATED RINGERS IV SOLN: INTRAVENOUS | Status: DC

## 2019-11-22 MED ORDER — TERBUTALINE SULFATE 1 MG/ML IJ SOLN: 0.2500 mg | Freq: Once | INTRAMUSCULAR | Status: DC | PRN

## 2019-11-22 MED ORDER — LACTATED RINGERS IV SOLN
500.0000 mL | INTRAVENOUS | Status: DC | PRN
  Administered 2019-11-23: 500 mL via INTRAVENOUS

## 2019-11-22 MED ORDER — SOD CITRATE-CITRIC ACID 500-334 MG/5ML PO SOLN: 30.0000 mL | ORAL | Status: DC | PRN

## 2019-11-22 MED ORDER — OXYCODONE-ACETAMINOPHEN 5-325 MG PO TABS: 2.0000 | ORAL_TABLET | ORAL | Status: DC | PRN

## 2019-11-22 MED ORDER — OXYCODONE-ACETAMINOPHEN 5-325 MG PO TABS: 1.0000 | ORAL_TABLET | ORAL | Status: DC | PRN

## 2019-11-22 MED ORDER — MISOPROSTOL 50MCG HALF TABLET
50.0000 ug | ORAL_TABLET | ORAL | Status: DC | PRN
  Administered 2019-11-22 (×2): 50 ug via BUCCAL
  Filled 2019-11-22 (×2): qty 1

## 2019-11-22 MED ORDER — OXYTOCIN BOLUS FROM INFUSION
500.0000 mL | Freq: Once | INTRAVENOUS | Status: AC
  Administered 2019-11-23: 500 mL via INTRAVENOUS

## 2019-11-22 MED ORDER — MISOPROSTOL 25 MCG QUARTER TABLET
25.0000 ug | ORAL_TABLET | ORAL | Status: DC | PRN
  Administered 2019-11-22 (×2): 25 ug via VAGINAL
  Filled 2019-11-22 (×2): qty 1

## 2019-11-22 MED ORDER — LIDOCAINE HCL (PF) 1 % IJ SOLN: 30.0000 mL | INTRAMUSCULAR | Status: DC | PRN

## 2019-11-22 MED ORDER — ONDANSETRON HCL 4 MG/2ML IJ SOLN
4.0000 mg | Freq: Four times a day (QID) | INTRAMUSCULAR | Status: DC | PRN
  Administered 2019-11-23: 4 mg via INTRAVENOUS
  Filled 2019-11-22: qty 2

## 2019-11-22 NOTE — Progress Notes (Signed)
Deborah Tate is a 18 y.o. G1P0 at [redacted]w[redacted]d admitted for IOL 2/2 IUGR  Subjective: Not feeling contractions, feeling baby move. No complaints.  Objective: BP (!) 102/53   Pulse 86   Temp 98.8 F (37.1 C) (Oral)   Resp 17   Ht 5\' 2"  (1.575 m)   Wt 74.4 kg   BMI 30.00 kg/m  No intake/output data recorded.  FHT:  FHR: 140 bpm, variability: moderate,  accelerations:  Present,  decelerations:  Absent UC:   none  SVE:   Dilation: Fingertip Effacement (%): Thick Station: -3 Exam by:: stone rnc  Labs: Lab Results  Component Value Date   WBC 8.6 11/22/2019   HGB 11.6 (L) 11/22/2019   HCT 33.5 (L) 11/22/2019   MCV 90.8 11/22/2019   PLT 223 11/22/2019    Assessment / Plan: Deborah Tate is a 18 y.o. G1P0 at [redacted]w[redacted]d here for IOL 2/2 IUGR 7%, borderline AFI (8cm>10 cm)  Labor: S/p cyto x2, third cyto given. Consider FB at next check. Fetal Wellbeing:  Category I Pain Control:  per patient request I/D:  GBS negative Anticipated MOD:  vaginal delivery, CS as appropriate  Merilyn Baba DO OB Fellow, [facility] 11/22/2019, 5:51 PM

## 2019-11-22 NOTE — Progress Notes (Addendum)
Labor Progress Note Deborah Tate is a 18 y.o. G1P0 at [redacted]w[redacted]d presented for IOL due to IUGR.  S: Feeling some cramping but no severe contractions yet. Not requiring any prn medications.   O:  BP 124/72   Pulse 68   Temp (!) 97.4 F (36.3 C) (Axillary)   Resp 16   Ht 5\' 2"  (1.575 m)   Wt 74.4 kg   BMI 30.00 kg/m  EFM: 140bpm/moderate variability/+accels, no decels  CVE: Dilation: 1 Effacement (%): 60 Cervical Position: Posterior Station: -2 Presentation: Vertex Exam by:: dr Darene Lamer   A&P: 18 y.o. G1P0 [redacted]w[redacted]d IOL due to IUGR. #Labor: Progressing well. S/p Cytotec 30mcg x2 and 56mcg x1, will redose. Placed FB 2229. Anticipate vaginal delivery.  #Pain: Doing well, epidural upon request. #FWB: Cat 1 FHT #GBS negative #COVID+: asymptomatic, continue airborne precautions  Demetrius Revel, MD 11:06 PM

## 2019-11-22 NOTE — H&P (Signed)
OBSTETRIC ADMISSION HISTORY AND PHYSICAL  Deborah Tate is a 18 y.o. female G1P0 with IUP at [redacted]w[redacted]d presenting for IOL 2/2 IUGR (7% at 37 weeks, borderline AFI 8cm->10cm, normal dopplers). She reports +FMs. No LOF, VB, blurry vision, headaches, peripheral edema, or RUQ pain. She plans on breastfeeding. She requests Depo for birth control.  Dating: By LMP c/w 7 week US--->  Estimated Date of Delivery: 12/06/19  Sono:    @[redacted]w[redacted]d , normal anatomy, cephalic presentation, 2694W, 12%ile, EFW 5#9  Prenatal History/Complications: Transfer of care @ 37 weeks IUGR: 7% at 37 weeks, borderline AFI 8cm->10cm, normal dopplers Fetal anatomy normal per care everywhere note (07/12/19)  Past Medical History: Past Medical History:  Diagnosis Date  . Medical history non-contributory     Past Surgical History: Past Surgical History:  Procedure Laterality Date  . NO PAST SURGERIES      Obstetrical History: OB History    Gravida  1   Para      Term      Preterm      AB      Living        SAB      TAB      Ectopic      Multiple      Live Births              Social History: Social History   Socioeconomic History  . Marital status: Single    Spouse name: Not on file  . Number of children: Not on file  . Years of education: Not on file  . Highest education level: Not on file  Occupational History  . Occupation: [employer]  Tobacco Use  . Smoking status: Former Smoker    Quit date: 11/15/2018    Years since quitting: 1.0  . Smokeless tobacco: Never Used  Substance and Sexual Activity  . Alcohol use: Never  . Drug use: Not Currently    Types: Marijuana  . Sexual activity: Yes    Partners: Male    Birth control/protection: Injection  Other Topics Concern  . Not on file  Social History Narrative  . Not on file   Social Determinants of Health   Financial Resource Strain:   . Difficulty of Paying Living Expenses: Not on file  Food Insecurity:   . Worried About  Charity fundraiser in the Last Year: Not on file  . Ran Out of Food in the Last Year: Not on file  Transportation Needs:   . Lack of Transportation (Medical): Not on file  . Lack of Transportation (Non-Medical): Not on file  Physical Activity:   . Days of Exercise per Week: Not on file  . Minutes of Exercise per Session: Not on file  Stress:   . Feeling of Stress : Not on file  Social Connections:   . Frequency of Communication with Friends and Family: Not on file  . Frequency of Social Gatherings with Friends and Family: Not on file  . Attends Religious Services: Not on file  . Active Member of Clubs or Organizations: Not on file  . Attends Archivist Meetings: Not on file  . Marital Status: Not on file    Family History: Family History  Problem Relation Age of Onset  . Gestational diabetes Mother   . Diabetes Maternal Grandmother   . Lung disease Maternal Grandfather     Allergies: No Known Allergies  Medications Prior to Admission  Medication Sig Dispense Refill Last Dose  .  Prenatal Vit-Fe Fumarate-FA (PNV FOLIC ACID + IRON) 27-1 MG TABS Take 1 tablet by mouth daily.         Review of Systems:  All systems reviewed and negative except as stated in HPI  PE: Blood pressure 102/77, pulse 90, temperature 98.5 F (36.9 C), temperature source Oral, resp. rate 18, height 5\' 2"  (1.575 m), weight 74.4 kg. General appearance: alert, cooperative and appears stated age Lungs: regular rate and effort Heart: regular rate  Abdomen: soft, non-tender Extremities: Homans sign is negative, no sign of DVT Presentation: cephalic EFM: 140-145 bpm, moderate variability, 15x15 accels, no decels Toco: no contractions Dilation: Fingertip Effacement (%): Thick Station: -3 Exam by:: stone rnc  Prenatal labs: ABO, Rh: B/Positive/-- (05/20 0000) Antibody: Negative (05/20 0000) Rubella: Immune (05/20 0000) RPR: Nonreactive (05/20 0000)  HBsAg: Negative (05/20 0000)  HIV:  Non-reactive (05/20 0000)  GBS: Negative/-- (12/10 0000)  1 hr GTT 108- normal  Prenatal Transfer Tool  Maternal Diabetes: No Genetic Screening: Normal per patient (transfer from [facility] @ 37 weeks) Maternal Ultrasounds/Referrals: IUGR Fetal Ultrasounds or other Referrals:  Referred to Materal Fetal Medicine for growth, small fundal height Maternal Substance Abuse:  No Significant Maternal Medications:  None Significant Maternal Lab Results: Group B Strep negative  Results for orders placed or performed during the hospital encounter of 11/22/19 (from the past 24 hour(s))  CBC   Collection Time: 11/22/19  8:54 AM  Result Value Ref Range   WBC 8.6 4.0 - 10.5 K/uL   RBC 3.69 (L) 3.87 - 5.11 MIL/uL   Hemoglobin 11.6 (L) 12.0 - 15.0 g/dL   HCT 11/24/19 (L) 58.5 - 27.7 %   MCV 90.8 80.0 - 100.0 fL   MCH 31.4 26.0 - 34.0 pg   MCHC 34.6 30.0 - 36.0 g/dL   RDW 82.4 23.5 - 36.1 %   Platelets 223 150 - 400 K/uL   nRBC 0.0 0.0 - 0.2 %    Patient Active Problem List   Diagnosis Date Noted  . IUGR (intrauterine growth restriction) affecting care of mother 11/22/2019  . Pregnancy affected by fetal growth restriction 11/20/2019  . Coronavirus infection 10/24/2019  . Supervision of normal first pregnancy, antepartum 04/26/2019    Assessment: Deborah Tate is a 18 y.o. G1P0 at [redacted]w[redacted]d here for IOL 2/2 IUGR 7%, borderline AFI (8cm>10 cm)  1. Labor: Bishop Score 0, start with cytotec for cervical ripening. Assess for FB at next check. Consider more cyto, AROM, nad Pit when appropriate 2. FWB: Cat I tracing, EFW  3. Pain: IV meds and epidural as desired 4. GBS: negative, no prophylaxis needed 5. Left chest pain 2 days ago: work up in ER negative, declined CXR at that time. Symptoms resolved with Pepcid, no chest pain since. 6. COVID positive: no signs or symptoms.   Plan: Risks and benefits of induction were reviewed, including failure of method, prolonged labor, need for further intervention,  risk of cesarean.  Patient and family seem to understand these risks and wish to proceed. Options of cytotec, foley bulb, AROM, and pitocin reviewed, with use of each discussed.  Anticipate vaginal delivery, CS as appropriate  Deborah Tate L Gentry Pilson, DO  11/22/2019, 9:33 AM

## 2019-11-23 ENCOUNTER — Inpatient Hospital Stay (HOSPITAL_COMMUNITY): Payer: Managed Care, Other (non HMO) | Admitting: Anesthesiology

## 2019-11-23 ENCOUNTER — Encounter (HOSPITAL_COMMUNITY): Payer: Self-pay | Admitting: Obstetrics & Gynecology

## 2019-11-23 ENCOUNTER — Encounter: Payer: Managed Care, Other (non HMO) | Admitting: Obstetrics and Gynecology

## 2019-11-23 DIAGNOSIS — O9852 Other viral diseases complicating childbirth: Secondary | ICD-10-CM

## 2019-11-23 DIAGNOSIS — O365930 Maternal care for other known or suspected poor fetal growth, third trimester, not applicable or unspecified: Secondary | ICD-10-CM

## 2019-11-23 DIAGNOSIS — Z3A38 38 weeks gestation of pregnancy: Secondary | ICD-10-CM

## 2019-11-23 DIAGNOSIS — U071 COVID-19: Secondary | ICD-10-CM

## 2019-11-23 MED ORDER — OXYCODONE HCL 5 MG PO TABS: 10.0000 mg | ORAL_TABLET | ORAL | Status: DC | PRN

## 2019-11-23 MED ORDER — OXYCODONE HCL 5 MG PO TABS: 5.0000 mg | ORAL_TABLET | ORAL | Status: DC | PRN

## 2019-11-23 MED ORDER — TETANUS-DIPHTH-ACELL PERTUSSIS 5-2.5-18.5 LF-MCG/0.5 IM SUSP: 0.5000 mL | Freq: Once | INTRAMUSCULAR | Status: DC

## 2019-11-23 MED ORDER — ACETAMINOPHEN 325 MG PO TABS: 650.0000 mg | ORAL_TABLET | ORAL | Status: DC | PRN

## 2019-11-23 MED ORDER — COCONUT OIL OIL
1.0000 "application " | TOPICAL_OIL | Status: DC | PRN
  Administered 2019-11-24: 1 via TOPICAL

## 2019-11-23 MED ORDER — DIBUCAINE (PERIANAL) 1 % EX OINT: 1.0000 "application " | TOPICAL_OINTMENT | CUTANEOUS | Status: DC | PRN

## 2019-11-23 MED ORDER — PHENYLEPHRINE 40 MCG/ML (10ML) SYRINGE FOR IV PUSH (FOR BLOOD PRESSURE SUPPORT): 80.0000 ug | PREFILLED_SYRINGE | INTRAVENOUS | Status: DC | PRN

## 2019-11-23 MED ORDER — ONDANSETRON HCL 4 MG/2ML IJ SOLN: 4.0000 mg | INTRAMUSCULAR | Status: DC | PRN

## 2019-11-23 MED ORDER — EPHEDRINE 5 MG/ML INJ: 10.0000 mg | INTRAVENOUS | Status: DC | PRN

## 2019-11-23 MED ORDER — ZOLPIDEM TARTRATE 5 MG PO TABS: 5.0000 mg | ORAL_TABLET | Freq: Every evening | ORAL | Status: DC | PRN

## 2019-11-23 MED ORDER — FENTANYL-BUPIVACAINE-NACL 0.5-0.125-0.9 MG/250ML-% EP SOLN
12.0000 mL/h | EPIDURAL | Status: DC | PRN
  Filled 2019-11-23: qty 250

## 2019-11-23 MED ORDER — FENTANYL CITRATE (PF) 100 MCG/2ML IJ SOLN
100.0000 ug | INTRAMUSCULAR | Status: DC | PRN
  Administered 2019-11-23 (×4): 100 ug via INTRAVENOUS
  Filled 2019-11-23 (×3): qty 2

## 2019-11-23 MED ORDER — DIPHENHYDRAMINE HCL 50 MG/ML IJ SOLN: 12.5000 mg | INTRAMUSCULAR | Status: DC | PRN

## 2019-11-23 MED ORDER — ONDANSETRON HCL 4 MG PO TABS: 4.0000 mg | ORAL_TABLET | ORAL | Status: DC | PRN

## 2019-11-23 MED ORDER — PRENATAL MULTIVITAMIN CH
1.0000 | ORAL_TABLET | Freq: Every day | ORAL | Status: DC
  Administered 2019-11-24 – 2019-11-25 (×2): 1 via ORAL
  Filled 2019-11-23 (×2): qty 1

## 2019-11-23 MED ORDER — WITCH HAZEL-GLYCERIN EX PADS: 1.0000 "application " | MEDICATED_PAD | CUTANEOUS | Status: DC | PRN

## 2019-11-23 MED ORDER — IBUPROFEN 600 MG PO TABS
600.0000 mg | ORAL_TABLET | Freq: Four times a day (QID) | ORAL | Status: DC
  Administered 2019-11-24 – 2019-11-25 (×7): 600 mg via ORAL
  Filled 2019-11-23 (×7): qty 1

## 2019-11-23 MED ORDER — LACTATED RINGERS IV SOLN
500.0000 mL | Freq: Once | INTRAVENOUS | Status: AC
  Administered 2019-11-23: 1000 mL via INTRAVENOUS

## 2019-11-23 MED ORDER — LIDOCAINE-EPINEPHRINE (PF) 2 %-1:200000 IJ SOLN
INTRAMUSCULAR | Status: DC | PRN
  Administered 2019-11-23 (×2): 2 mL via EPIDURAL

## 2019-11-23 MED ORDER — SODIUM CHLORIDE (PF) 0.9 % IJ SOLN
INTRAMUSCULAR | Status: DC | PRN
  Administered 2019-11-23: 12 mL/h via EPIDURAL

## 2019-11-23 MED ORDER — SIMETHICONE 80 MG PO CHEW: 80.0000 mg | CHEWABLE_TABLET | ORAL | Status: DC | PRN

## 2019-11-23 MED ORDER — DIPHENHYDRAMINE HCL 25 MG PO CAPS: 25.0000 mg | ORAL_CAPSULE | Freq: Four times a day (QID) | ORAL | Status: DC | PRN

## 2019-11-23 MED ORDER — BENZOCAINE-MENTHOL 20-0.5 % EX AERO: 1.0000 "application " | INHALATION_SPRAY | CUTANEOUS | Status: DC | PRN

## 2019-11-23 MED ORDER — POLYETHYLENE GLYCOL 3350 17 G PO PACK
17.0000 g | PACK | Freq: Two times a day (BID) | ORAL | Status: DC
  Filled 2019-11-23 (×2): qty 1

## 2019-11-23 MED ORDER — OXYTOCIN 40 UNITS IN NORMAL SALINE INFUSION - SIMPLE MED
INTRAVENOUS | Status: AC
  Filled 2019-11-23: qty 1000

## 2019-11-23 MED ORDER — FENTANYL CITRATE (PF) 100 MCG/2ML IJ SOLN
INTRAMUSCULAR | Status: AC
  Filled 2019-11-23: qty 2

## 2019-11-23 MED ORDER — OXYTOCIN 40 UNITS IN NORMAL SALINE INFUSION - SIMPLE MED
1.0000 m[IU]/min | INTRAVENOUS | Status: DC
  Administered 2019-11-23: 2 m[IU]/min via INTRAVENOUS

## 2019-11-23 MED ORDER — TERBUTALINE SULFATE 1 MG/ML IJ SOLN: 0.2500 mg | Freq: Once | INTRAMUSCULAR | Status: DC | PRN

## 2019-11-23 NOTE — Progress Notes (Signed)
Did not have a room assignment for this patient.  Called L&D Network engineer, (reminding of pt's + Covid status); who called [facility]. Received return call with a room assignment of 401 for patient.  Called [facility] 4th floor to give report to receiving nurse at 1514, but RN with another pt. RN will call this RN back when available.

## 2019-11-23 NOTE — Progress Notes (Signed)
Myrta Bittman is a 18 y.o. G1P0 at [redacted]w[redacted]d admitted for induction of labor due to IUGR.  Subjective: Comfortable with epidural in place. Denies any SOB.  Feeling little anxious getting closer to delivery  Objective: BP 106/61   Pulse 70   Temp 98.1 F (36.7 C) (Oral)   Resp 18   Ht 5\' 2"  (1.575 m)   Wt 74.4 kg   SpO2 100%   BMI 30.00 kg/m  No intake/output data recorded.  FHT:  FHR: 150 bpm, variability: moderate,  accelerations:  Present,  decelerations:  Absent UC:   Moderate on Toco  SVE:   Dilation: 6.5 Effacement (%): 90 Station: Plus 1 Exam by:: Curly Shores, RN  Pitocin @ 6 mu/min  Labs: Lab Results  Component Value Date   WBC 8.6 11/22/2019   HGB 11.6 (L) 11/22/2019   HCT 33.5 (L) 11/22/2019   MCV 90.8 11/22/2019   PLT 223 11/22/2019    Assessment / Plan: Abbye Lao is a 18 y.o G1P1 at [redacted]w[redacted]d here for IOL due to IUGR  Labor: s/p Cytotec x 4, FB, Pitocin.  Continue to titrate Pitocin.  AROM as appropriate Fetal Wellbeing:  Category I Pain Control:  Epidural I/D: GBS negative COVID positive:  Asymptomatic, continue airborne and contact precautions. Anticipated MOD:  Vaginal Delivery, CS as appropriate  Carollee Leitz MD PGY1 Family Med Practice 11/23/2019, 11:13 AM

## 2019-11-23 NOTE — Progress Notes (Signed)
Labor Progress Note Deborah Tate is a 18 y.o. G1P0 at [redacted]w[redacted]d presented for  IOL due to IUGR.  S: Upon standing she noticing a small gush of fluid which was bloody. Her FB fell out and she did have some more vaginal bleeding with small clots, <100cc. Her pain has improved dramatically after the FB fell out, 1 dose fentanyl. She denies weakness, lightheadedness, chest pain, sob.   O:  BP 117/66   Pulse 87   Temp (!) 97.4 F (36.3 C) (Axillary)   Resp 18   Ht 5\' 2"  (1.575 m)   Wt 74.4 kg   BMI 30.00 kg/m  EFM: 130bpm/moderate variability/+accels, no decels  CVE: Dilation: 3.5 Effacement (%): 80 Cervical Position: Posterior Station: -2 Presentation: Vertex Exam by:: Dr. Lia Foyer   A&P: 18 y.o. G1P0 [redacted]w[redacted]d  IOL due to IUGR.  #Labor: Progressing well. S/p Cytotec 10mcg x2 and 78mcg x2, FB fell out around 0225. Will start pitocin gtt and uptitrate as indicated.  #Vaginal bleeding: Likely bloody show from FB falling out. Low suspicion for abruption  Given non-painful, mother hemodynamically stable and Cat 1 FHT. SROM unlikely and difficult to evaluate as vaginal bleeding complicates testing. Will cont to monitor closely.  #Pain: managed w/ fentanyl, epidural upon request #FWB: Cat 1 FHT #GBS negative #COVID+: asymptomatic, continue airborne precautions  Demetrius Revel, MD 2:34 AM

## 2019-11-23 NOTE — Discharge Summary (Signed)
Postpartum Discharge Summary  Date of Service updated 11/25/2019     Patient Name: Deborah Tate DOB: Nov 02, 2001 MRN: [id number]  Date of admission: 11/22/2019 Delivering Provider: Clarnce Flock   Date of discharge: 11/25/2019  Admitting diagnosis: IUGR (intrauterine growth restriction) affecting care of mother [O36.5990] Intrauterine pregnancy: [redacted]w[redacted]d    Secondary diagnosis:  Active Problems:   Supervision of normal first pregnancy, antepartum   Coronavirus infection   Pregnancy affected by fetal growth restriction   IUGR (intrauterine growth restriction) affecting care of mother  Additional problems: None     Discharge diagnosis: Term Pregnancy Delivered                                                                                                Post partum procedures:None  Augmentation: Pitocin, Cytotec and Foley Balloon  Complications: None  Hospital course:  Induction of Labor With Vaginal Delivery   18y.o. yo G1P0 at 350w1das admitted to the hospital 11/22/2019 for induction of labor.  Indication for induction: IUGR 7%.  Patient had an uncomplicated labor course as follows: induced with misoprostol, foley bulb, and pitocin and achieved full dilation and uncomplicated NSVD. Membrane Rupture Time/Date:  ,  unknown Intrapartum Procedures: Episiotomy: None [1]                                         Lacerations:  Periurethral [8]  Patient had delivery of a Viable infant.  Information for the patient's newborn:  AlRashidah, [place] [email or web address][[id number]]Delivery Method: Vag-Spont    11/23/2019  Details of delivery can be found in separate delivery note.  Patient had a routine postpartum course. Patient is discharged home 11/25/19. Delivery time: 1:27 PM    Magnesium Sulfate received: No BMZ received: No Rhophylac:N/A MMR:N/A Transfusion:No  Physical exam  Vitals:   11/24/19 0054 11/24/19 0545 11/24/19 1956 11/25/19 0436  BP: (!) 103/53 (!) 103/56 109/63 110/64   Pulse: 85 76 67 91  Resp: '18 18 18 18  ' Temp: 98.7 F (37.1 C) 98.2 F (36.8 C) 98.1 F (36.7 C) 98.1 F (36.7 C)  TempSrc: Oral Oral Oral Oral  SpO2: 99% 100% 100% 100%  Weight:      Height:       General: alert, cooperative and no distress Lochia: appropriate Uterine Fundus: firm Incision: N/A DVT Evaluation: No evidence of DVT seen on physical exam. Negative Homan's sign. No cords or calf tenderness. No significant calf/ankle edema. Labs: Lab Results  Component Value Date   WBC 8.6 11/22/2019   HGB 11.6 (L) 11/22/2019   HCT 33.5 (L) 11/22/2019   MCV 90.8 11/22/2019   PLT 223 11/22/2019   CMP Latest Ref Rng & Units 11/20/2019  Glucose 70 - 99 mg/dL 91  BUN 6 - 20 mg/dL <5(L)  Creatinine 0.44 - 1.00 mg/dL 0.71  Sodium 135 - 145 mmol/L 135  Potassium 3.5 - 5.1 mmol/L 3.9  Chloride 98 - 111 mmol/L 103  CO2 22 - 32 mmol/L 23  Calcium 8.9 - 10.3 mg/dL 8.7(L)  Total Protein 6.5 - 8.1 g/dL -  Total Bilirubin 0.3 - 1.2 mg/dL -  Alkaline Phos 38 - 126 U/L -  AST 15 - 41 U/L -  ALT 0 - 44 U/L -    Discharge instruction: per After Visit Summary and "Baby and Me Booklet".  After visit meds:  Allergies as of 11/25/2019   No Known Allergies     Medication List    TAKE these medications   acetaminophen 325 MG tablet Commonly known as: Tylenol Take 2 tablets (650 mg total) by mouth every 4 (four) hours as needed (for pain scale < 4).   ibuprofen 600 MG tablet Commonly known as: ADVIL Take 1 tablet (600 mg total) by mouth every 6 (six) hours.   PNV Folic Acid + Iron 97-0 MG Tabs Take 1 tablet by mouth daily.       Diet: routine diet  Activity: Advance as tolerated. Pelvic rest for 6 weeks.   Outpatient follow up:6 weeks Follow up Appt: Future Appointments  Date Time Provider Centre  12/25/2019  9:00 AM Guss Bunde, MD [facility]   Follow up Visit:   Please schedule this patient for Postpartum visit in: 6 weeks with the  following provider: Any provider For C/S patients schedule nurse incision check in weeks 2 weeks: no High risk pregnancy complicated by: IUGR, COVID infection Delivery mode:  SVD Anticipated Birth Control:  Depo PP Procedures needed: none  Schedule Integrated BH visit: no   Newborn Data: Live born female  Birth Weight:  female APGAR: 8/9, 2325g  Newborn Delivery   Birth date/time: 11/23/2019 13:27:00 Delivery type: Vaginal, Spontaneous      Baby Feeding: Breast Disposition:home with mother   11/25/2019 Carollee Leitz, MD

## 2019-11-23 NOTE — Anesthesia Preprocedure Evaluation (Signed)
Anesthesia Evaluation  Patient identified by MRN, date of birth, ID band Patient awake    Reviewed: Allergy & Precautions, NPO status , Patient's Chart, lab work & pertinent test results  Airway Mallampati: II  TM Distance: >3 FB Neck ROM: Full    Dental no notable dental hx.    Pulmonary neg pulmonary ROS, former smoker,  COVID positive, asymptomatic   Pulmonary exam normal breath sounds clear to auscultation       Cardiovascular negative cardio ROS Normal cardiovascular exam Rhythm:Regular Rate:Normal     Neuro/Psych negative neurological ROS  negative psych ROS   GI/Hepatic negative GI ROS, Neg liver ROS,   Endo/Other  negative endocrine ROS  Renal/GU negative Renal ROS  negative genitourinary   Musculoskeletal negative musculoskeletal ROS (+)   Abdominal   Peds  Hematology  (+) Blood dyscrasia, anemia ,   Anesthesia Other Findings   Reproductive/Obstetrics (+) Pregnancy                             Anesthesia Physical Anesthesia Plan  ASA: II  Anesthesia Plan: Epidural   Post-op Pain Management:    Induction:   PONV Risk Score and Plan: Treatment may vary due to age or medical condition  Airway Management Planned: Natural Airway  Additional Equipment:   Intra-op Plan:   Post-operative Plan:   Informed Consent: I have reviewed the patients History and Physical, chart, labs and discussed the procedure including the risks, benefits and alternatives for the proposed anesthesia with the patient or authorized representative who has indicated his/her understanding and acceptance.       Plan Discussed with: Anesthesiologist  Anesthesia Plan Comments: (Patient identified. Risks, benefits, options discussed with patient including but not limited to bleeding, infection, nerve damage, paralysis, failed block, incomplete pain control, headache, blood pressure changes, nausea,  vomiting, reactions to medication, itching, and post partum back pain. Confirmed with bedside nurse the patient's most recent platelet count. Confirmed with the patient that they are not taking any anticoagulation, have any bleeding history or any family history of bleeding disorders. Patient expressed understanding and wishes to proceed. All questions were answered. )        Anesthesia Quick Evaluation

## 2019-11-23 NOTE — Progress Notes (Signed)
Labor Progress Note Deborah Tate is a 18 y.o. G1P0 at [redacted]w[redacted]d presented for IOL due to IUGR. S: She is doing well. Pain adequately controlled w/ fentanyl IV, considering an epidural. Continues to have some vaginal bleeding of dark blood.   O:  BP 97/69   Pulse 72   Temp 98.1 F (36.7 C) (Oral)   Resp 16   Ht 5\' 2"  (1.575 m)   Wt 74.4 kg   SpO2 99%   BMI 30.00 kg/m  EFM: 125bpm/mod var/+accels, no decels  CVE: Dilation: 4.5 Effacement (%): 90 Cervical Position: Posterior Station: -1 Presentation: Vertex Exam by:: Dr. Lia Foyer   A&P: 18 y.o. G1P0 [redacted]w[redacted]d IOL due to IUGR #Labor: Progressing well. S/p Cytotec26mcg x2 and68mcg x2, FB fell out around 0225. Started pitocin gtt at 0250, now up to 4. Uptitrate as indicated.  #Vaginal bleeding: She remains hemodynamically stable w/ Cat 1 FHT and well controlled pain. Consider abruption, but will continue to manage labor expectantly unless condition declines. SROM unlikely and difficult to evaluate as vaginal bleeding complicates testing. Will cont to monitor closely.  #Pain: managed w/ fentanyl, epidural upon request #FWB: Cat 1 FHT #GBS negative #COVID+: asymptomatic, continue airborne precautions  Demetrius Revel, MD 7:03 AM

## 2019-11-23 NOTE — Anesthesia Postprocedure Evaluation (Signed)
Anesthesia Post Note  Patient: Deborah Tate  Procedure(s) Performed: AN AD HOC LABOR EPIDURAL     Patient location during evaluation: Mother Baby Anesthesia Type: Epidural Level of consciousness: awake and alert Pain management: pain level controlled Vital Signs Assessment: post-procedure vital signs reviewed and stable Respiratory status: spontaneous breathing, nonlabored ventilation and respiratory function stable Cardiovascular status: stable Postop Assessment: no headache, no backache and epidural receding Anesthetic complications: no    Last Vitals:  Vitals:   11/23/19 1558 11/23/19 1640  BP: (!) 83/53 104/68  Pulse: 82 71  Resp: 20 20  Temp:  37.2 C  SpO2:  100%    Last Pain:  Vitals:   11/23/19 1640  TempSrc: Oral  PainSc: 0-No pain   Pain Goal: Patients Stated Pain Goal: 7 (11/23/19 0755)              Epidural/Spinal Function Cutaneous sensation: Able to Wiggle Toes (11/23/19 1640), Patient able to flex knees: Yes (11/23/19 1640), Patient able to lift hips off bed: Yes (11/23/19 1640), Back pain beyond tenderness at insertion site: No (11/23/19 1640), Progressively worsening motor and/or sensory loss: No (11/23/19 1640), Bowel and/or bladder incontinence post epidural: No (11/23/19 1640)  Shadd Dunstan

## 2019-11-23 NOTE — Anesthesia Procedure Notes (Signed)

## 2019-11-24 MED ORDER — MEDROXYPROGESTERONE ACETATE 150 MG/ML IM SUSP
150.0000 mg | Freq: Once | INTRAMUSCULAR | Status: AC
  Administered 2019-11-24: 150 mg via INTRAMUSCULAR
  Filled 2019-11-24: qty 1

## 2019-11-24 NOTE — Progress Notes (Signed)
Mom and baby safety reviewed. Hand expression taught. Emergency bell explained. Mother encouraged to supplement after feedings with EBM. Mom told about feeding cues.

## 2019-11-24 NOTE — Lactation Note (Addendum)
This note was copied from a baby's chart. Lactation Consultation Note  Patient Name: Deborah Tate [id number] Date: 11/24/2019 Reason for consult: Initial assessment;1st time breastfeeding;Infant < 6lbs P1, 12 hour ETI female infant, IUGR less than 6 lbs. Mom is COVID postive. Infant had two voids, one that LC changed while in room and one stool since birth. Tools: hand pump given to pre-pump prior to latching infant due mom being short shafted.            DEBP given for mom pump every 3 hours both breast for 15 minutes on initial setting due to  infant being  ETI and having  IUGR. LC entered room infant was cuing to breastfeed. LC discussed with mom, possibly supplementing with donor breast milk due to infant having IUGR and infant's size. Mom will currently breastfeed infant, hand express afterwards  and give back any EBM volume and use DEBP every 3 hours for 15 minutes,  she will think about donor milk but chose not to supplement. Mom taught back hand expression and colostrum is present in both breast. Per mom, infant doesn't latch well to left breast has been latching on her right.. Mom latched infant on left breast using the football hold, infant was on and off breast at first, [facility] mention importance of mom supporting infant with her hand below infant's head and keep infant close to breast. Infant breastfeed for 10 minutes , mom switched infant on right breast and used cross cradle position, infant sustains latch and is not off and on breast using the cross cradle hold. LC encouraged mom continue working on latch, bring infant chin first to breast, with nose and chin touching breast, infant was still breastfeeding after 15 minutes when [place] left room. Mom understands to breastfeed infant according to hunger cues, on demand , 8 to 12 times within 24 hours and not to exceed 3 hours without breastfeeding infant. Mom will ask  RN or LC for assistance with latching infant at breast if needed. Mom  shown how to use DEBP & how to disassemble, clean, & reassemble parts. Reviewed Baby & Me book's Breastfeeding Basics.  Mom made aware of O/P services, breastfeeding support groups, community resources, and our phone # for post-discharge questions.    Maternal Data Formula Feeding for Exclusion: No Has patient been taught Hand Expression?: Yes Does the patient have breastfeeding experience prior to this delivery?: No  Feeding Feeding Type: Breast Fed  LATCH Score Latch: Grasps breast easily, tongue down, lips flanged, rhythmical sucking.  Audible Swallowing: Spontaneous and intermittent  Type of Nipple: Everted at rest and after stimulation(short shaft will pre=pump prior to latching infant at breast.)  Comfort (Breast/Nipple): Soft / non-tender  Hold (Positioning): Assistance needed to correctly position infant at breast and maintain latch.  LATCH Score: 9  Interventions Interventions: Breast feeding basics reviewed;Breast compression;Adjust position;Assisted with latch;Hand pump;DEBP;Support pillows;Skin to skin;Breast massage;Position options;Hand express;Expressed milk;Pre-pump if needed  Lactation Tools Discussed/Used Tools: Pump Breast pump type: Double-Electric Breast Pump;Manual WIC Program: Yes Pump Review: Setup, frequency, and cleaning;Milk Storage Initiated by:: Vicente Serene, IBCLC Date initiated:: 11/24/19   Consult Status Consult Status: Follow-up Date: 11/24/19 Follow-up type: In-patient    Vicente Serene 11/24/2019, 2:09 AM

## 2019-11-24 NOTE — Lactation Note (Signed)
This note was copied from a baby's chart. Lactation Consultation Note  Patient Name: Girl Victoria Henshaw URKYH'C Date: 11/24/2019 Reason for consult: Follow-up assessment;1st time breastfeeding;Infant < 6lbs;Early term 37-38.6wks;Infant weight loss  45 hours old ETI female who is now being partially BF and formula fed by her mother, she's a P1. Mom has been putting baby to breast consistently but she hasn't been pumping/supplementing every 3 hours. Baby is only at 3% weight loss but that cause her to drop to < 5 lbs today. She's currently on Similac 22 calorie formula.  Dad doing STS with baby when entering the room, while mom was pumping, she is COVID (+). She told LC that baby didn't latch on the last feeding and they started to supplement with formula, explained to mom that the amount of supplementation will be the ones required by LPI policy due to baby's birth weight instead of supplementation guidelines for term babies. Parents voiced understanding.  LC assisted mom with pumping, she got more drops when doing breast compressions, mom got a total of 3 ml, praised her for her efforts. Breastmilk storage guidelines were reviewed, as well as pumping schedule. LC also washed mom's pump parts and requested coconut oil and showed her how to use it.   Mom told LC that baby has taken only a few mls of formula, LC explained to parents that due to baby's age in hours she should be taking between 10-20 ml per feeding and that amount will go up to 20-30 ml tomorrow once she turns 48 hours, they voiced understanding. LC also requested extra bottles of Similac 22 per mom request, she only had one left.  Feeding plan:  1. Encouraged mom to continue feeding baby STS 8-12 times/24 hours or sooner if feeding cues are present 2. Mom will continue pumping every 3 hours and will offer any amount of EBM she may get first prior offering formula 3. Parents will supplement baby after every feeding at the breast with  10-20 ml of EBM/formula per feeding per supplementation guidelines for LPIs according to baby's age in hours  Parents reported all questions and concerns were answered, they're both aware of [facility] OP services and will call PRN.   Maternal Data    Feeding Feeding Type: Formula Nipple Type: Slow - flow  LATCH Score                   Interventions Interventions: Breast feeding basics reviewed;Breast compression;DEBP;Coconut oil  Lactation Tools Discussed/Used Tools: Pump;Coconut oil Breast pump type: Double-Electric Breast Pump   Consult Status Consult Status: Follow-up Date: 11/25/19 Follow-up type: In-patient    Aunesty Tyson Francene Boyers 11/24/2019, 11:52 PM

## 2019-11-24 NOTE — Progress Notes (Addendum)
POSTPARTUM PROGRESS NOTE  Subjective: Deborah Tate is a 18 y.o. G1P1001 s/p Vaginal Delivery at [redacted]w[redacted]d  She reports she doing well. No acute events overnight. She denies any problems with ambulating, voiding or po intake. Denies nausea or vomiting. She has  passed flatus. Pain is well controlled.  Lochia is appropriate.  Objective: Blood pressure (!) 103/56, pulse 76, temperature 98.2 F (36.8 C), temperature source Oral, resp. rate 18, height '5\' 2"'  (1.575 m), weight 74.4 kg, SpO2 100 %, unknown if currently breastfeeding.  Physical Exam:  General: alert, cooperative and no distress Chest: no respiratory distress Abdomen: soft, non-tender  Uterine Fundus: firm, appropriately tender Extremities: No calf swelling or tenderness  no edema  Recent Labs    11/22/19 0854  HGB 11.6*  HCT 33.5*    Assessment/Plan: Deborah Tate is a 18y.o. G1P1001 s/p Vaginal Delivery at 373w1dor IOL 2/2 IUGR.  Routine Postpartum Care: Doing well, pain well-controlled.  -- Continue routine care, lactation support  -- Contraception: Depo -- Feeding: breast and bottle  Dispo: Plan for discharge later today or tomorrow.  TaCarollee LeitzD PGMeadowdaleor WoCenter Hillersonally saw and evaluated the patient, performing the key elements of the service. I developed and verified the management plan that is described in the resident's/student's note, and I agree with the content with my edits above. VSS, HRR&R, Resp unlabored, Legs neg.  FrNigel BertholdCNM 11/24/2019 7:47 AM

## 2019-11-25 MED ORDER — IBUPROFEN 600 MG PO TABS: 600.0000 mg | ORAL_TABLET | Freq: Four times a day (QID) | ORAL | 0 refills | Status: DC

## 2019-11-25 MED ORDER — ACETAMINOPHEN 325 MG PO TABS: 650.0000 mg | ORAL_TABLET | ORAL | 0 refills | Status: DC | PRN

## 2019-11-25 NOTE — Lactation Note (Signed)
This note was copied from a baby's chart. Lactation Consultation Note  Patient Name: Deborah Tate Date: 11/25/2019 Reason for consult: Follow-up assessment  Baby is 28 hours old  As LC entered at 93 - per mom the baby recently fed 3 ml of EBM and 5 ml of  Formula. Baby awake and acting hungry.  LC offered to assist and changed a stool diaper - transitional.  LC assisted to latch - breast filling and easily hand express, areola compressible. LC showed mom how to use the Tea hold latch and  Baby would latched for short interval swallow and release.  Baby may be use to a firmer nipple is her mouth.  LC recommended trying the NS and was fitted for the #24 NS , comfortable  Per mom and the baby latched easily with depth and fed for 7 mins with swallows / increased with moist heat to the breast and compressions/ milk in the NS after baby released. Mom was able to apply NS without difficulty.  LC fixed mom a bottle with 15 ml of formula for supplementing and reviewed  PACE feeding, baby has been using the green nipple and tolerated well.  LC recommended working with baby to take at least 15 ml and progress upward to 30 ml by the end of day.  After feeding the 1st breast and supplementing / post pump for 15 -20 mins  And save the milk for the next feeding.  LC praised mom for for her efforts and reassured her with and ET, less than 5 pound baby the most important feeding goal is to feed with feeding cues and  Calories gradually stretching the baby's belly so she will grow.  Discussed Supply and demand/ importance of consistent post pumping to establish her milk supply and protect it.  LC spoke with Dr. Earle Gell and she is not discharging baby today.    Maternal Data Has patient been taught Hand Expression?: Yes Does the patient have breastfeeding experience prior to this delivery?: No  Feeding Feeding Type: Formula Nipple Type: Slow - flow  LATCH Score Latch: Grasps breast  easily, tongue down, lips flanged, rhythmical sucking.  Audible Swallowing: Spontaneous and intermittent  Type of Nipple: Everted at rest and after stimulation  Comfort (Breast/Nipple): Filling, red/small blisters or bruises, mild/mod discomfort  Hold (Positioning): Assistance needed to correctly position infant at breast and maintain latch.  LATCH Score: 8  Interventions Interventions: Breast feeding basics reviewed  Lactation Tools Discussed/Used Tools: Flanges;Nipple Shields Nipple shield size: 24(good fit and mom able to return demo) Flange Size: 24 Breast pump type: Double-Electric Breast Pump;Manual Pump Review: Milk Storage   Consult Status Consult Status: Follow-up Date: 11/26/19 Follow-up type: In-patient    [place] 11/25/2019, 11:37 AM

## 2019-11-26 ENCOUNTER — Ambulatory Visit: Payer: Self-pay

## 2019-11-26 NOTE — Lactation Note (Signed)
This note was copied from a baby's chart. Lactation Consultation Note  Patient Name: Deborah Tate Date: 11/26/2019 Reason for consult: Follow-up assessment;Primapara;1st time breastfeeding;Early term 37-38.6wks;Infant < 6lbs;Infant weight loss;Other (Comment)(baby gained 3 % back from yesterday / [place] plan working well) [name] is 65 hours old and mom doing well with the [facility] plan.  As LC entered the room mom had just finished breast feeding / supplementing the baby and is setting up the DEBP to pump due to being very full bilaterally but not engorged . Mom denies sore nipples. Sore nipple and engorgement prevention and tx reviewed. Mom has a hand pump, DEBP key and a DEBP Motiff at home.  LC stressed the importance of being consistent with post pumping to prevent  Engorgement and protect her milk supply. LC praised mom for her efforts breast feeding , pumping and caring for for her baby. Mom has good understanding of the [facility] plan.  LC reviewed the [facility] plan and feeding goals for 24 hours ( 8-12 feedings in 24 hours with feeding cues and by 3 hours due to the baby being less than 5 pounds, if the baby is to sluggish to latch, try an appetizer 1st and then latch if still sluggish feed the baby her supplement at least 30 ml and then latch.  LC stressed the importance of feedings .  STS feedings are important and benefits.  If breast are to full need to release breast enough so the areola is compressible to insure the Nipple Shield will fit properly.  Per mom feels comfortable applying the NS and instilling the EBM in the top.  LC offered to request and LC O/P appt in about 1 week in Epic with the clinic and mom receptive and aware the clinic will call her Monday.  Mom aware she can call the [facility] office if breast feeding questions prior to her [place] O/P appt.   Mom pumped and was getting over 45 ml off her breast and pe rmom breast more comfortable , extra storage bottles provided and nipples.     Maternal Data Has patient been taught Hand Expression?: Yes  Feeding Feeding Type: (baby recently fed and LC updated the doc flow sheets)  LATCH Score                   Interventions Interventions: Breast feeding basics reviewed  Lactation Tools Discussed/Used Tools: Shells;Pump;Flanges Nipple shield size: 24(LC provided and extra one for mom) Flange Size: 24;27 Shell Type: Inverted Breast pump type: Double-Electric Breast Pump;Manual WIC Program: Yes Pump Review: Milk Storage   Consult Status Consult Status: Follow-up(LC offered mom to request and LC O/P appt for 1 week out . mom receptive) Date: (Bar [place]unn placed a request in the [place] O.P.Epic  basket for mom to get a call) Follow-up type: [place] 11/26/2019, 12:47 PM

## 2019-11-27 LAB — SURGICAL PATHOLOGY

## 2019-12-14 ENCOUNTER — Ambulatory Visit: Payer: Self-pay

## 2019-12-14 NOTE — Lactation Note (Signed)
This note was copied from a baby's chart. Lactation Consultation Note  Patient Name: Deborah Tate WIOXB'D Date: 12/14/2019    12/14/2019  Name: Deborah Tate MRN: [id number] Date of Birth: 11/23/2020 Gestational Age: Gestational Age: [redacted]w[redacted]d Birth Weight: 82 oz Weight today:  Weight: 6 lb 1.9 oz (3125 g)   88 week old ET infant presents today with mom for feeding assessment.   Infant has gained 522 grams in the last 18 days with an average daily weight gain of 29 grams a day.   Infant is self awakening to feed every 2.5-3 hours. Mom is now bottle feeding infant for most feedings. Mom had difficulty with keeping the NS in place to use for feeding.   Infant with sucking blister to upper center lip. Infant with thin labial frenulum that inserts at the bottom of the gum ridge. Infant with strong suckle on gloved finger with good tongue extension. Infant with good tongue extension and lateralization. Tongue can raise to the roof of the mouth.  Infant latched well and transferred well. Mom with no pain with feeding. Infant fed well and transferred well.   Reviewed hands free bra for pumping. Enc mom to massage breasts with feedings.  Infant to follow up with Dr. Barbaraann Rondo at 2 months. Infant to follow up with Lactation    General Information: Mother's reason for visit: feeding assessment, difficulty latching, ET infant Consult: Initial Lactation consultant: Deborah Mattes RN,IBCLC Breastfeeding experience: not latching well, pumping and bottle feeding   Maternal medications: Other, Pre-natal vitamin(Depo Provera)  Breastfeeding History: Frequency of breast feeding: not latching    Supplementation: Supplement method: bottle([facility] green preemie nipple)         Breast milk volume: 60-75 Breast milk frequency: every 2.5-3 hours   Pump type: Other(Motif Luna) Pump frequency: every 3 hours during the day and up to 6 hours at night Pump volume: 120 ml  Infant Output  Assessment: Voids per 24 hours: 8+ Urine color: Clear yellow Stools per 24 hours: 5-6 Stool color: Yellow  Breast Assessment: Breast: Soft, Compressible, Hyperplasia Nipple: Erect Pain level: 0 Pain interventions: Bra, Breast pump  Feeding Assessment: Infant oral assessment: Variance Infant oral assessment comment: see note Positioning: Cross cradle(left breast, 10 minutes) Latch: 2 - Grasps breast easily, tongue down, lips flanged, rhythmical sucking. Audible swallowing: 2 - Spontaneous and intermittent Type of nipple: 2 - Everted at rest and after stimulation Comfort: 2 - Soft/non-tender Hold: 1 - Assistance needed to correctly position infant at breast and maintain latch LATCH score: 9 Latch assessment: Deep Lips flanged: Yes Suck assessment: Displays both   Pre-feed weight: 2776 grams/2832 grams after diaper change Post feed weight: 2840 grams/2838 grams Amount transferred: 70 ml Amount supplemented: 0  Additional Feeding Assessment:                                    Totals: Total amount transferred: 70 ml Total supplement given: 0 Total amount pumped post feed: did not pump   Pump:  1. Offer infant the breast with feeding cues as mom and infant want 2. Keep infant awake with feedings as needed  3. Feed infant skin to skin 4. Offer both breasts with each feeding 5. Empty the first breast before offering the second breast 6. Offer infant a bottle of pumped breast milk if she is still cueing to feed after breast feeding 7. When offering the bottle, feed  using the paced bottle feeding method (video on [email or web address]) 8. Infant needs about 51-68 ml (1.5-2.5 ounces) for 8 feedings a day or 405-540 ml (14-18 ounces) in 24 hours. Infant may take more or less depending on how often infant feeds. Feed infant until she is satisfied.  9. Continue pumping about 8 times a day for 15-20 minutes to promote and protect your milk supply. Pump using your Double  electric breast pump, may be helpful to massage breasts with pumping.  10. Keep up the good work 11. Thank you for allowing me to assist you today 12. Please call with any questions and concerns as needed [phone] 13. Follow up with Lactation in 2 weeks   Ed Blalock RN, IBCLC                                                         Deborah Tate Deborah Tate 12/14/2019, 2:22 PM

## 2019-12-25 ENCOUNTER — Ambulatory Visit: Payer: Managed Care, Other (non HMO) | Admitting: Obstetrics & Gynecology

## 2020-01-09 ENCOUNTER — Ambulatory Visit (INDEPENDENT_AMBULATORY_CARE_PROVIDER_SITE_OTHER): Payer: Managed Care, Other (non HMO) | Admitting: Certified Nurse Midwife

## 2020-01-09 ENCOUNTER — Encounter: Payer: Self-pay | Admitting: Certified Nurse Midwife

## 2020-01-09 ENCOUNTER — Other Ambulatory Visit: Payer: Self-pay

## 2020-01-09 DIAGNOSIS — Z3042 Encounter for surveillance of injectable contraceptive: Secondary | ICD-10-CM

## 2020-01-09 MED ORDER — MEDROXYPROGESTERONE ACETATE 150 MG/ML IM SUSP: 150.0000 mg | INTRAMUSCULAR | 2 refills | Status: DC

## 2020-01-09 NOTE — Patient Instructions (Signed)

## 2020-01-09 NOTE — Progress Notes (Signed)
Post Partum Exam  Deborah Tate is a 19 y.o. G66P1001 female who presents for a postpartum visit. She is 6 weeks postpartum following a spontaneous vaginal delivery. I have fully reviewed the prenatal and intrapartum course. The delivery was at 36 gestational weeks.  Anesthesia: epidural. Postpartum course has been unremarkable. Baby's course has been unremarkable. Baby is feeding by breast. Bleeding staining only. Bowel function is normal. Bladder function is normal. Patient is not sexually active. Contraception method is Depo-Provera injections. Postpartum depression screening:neg  The following portions of the patient's history were reviewed and updated as appropriate: allergies, current medications, past medical history, past surgical history and problem list.  Review of Systems A comprehensive review of systems was negative.    Objective:  Blood pressure 122/75, pulse 86, height 5\' 2"  (1.575 m), weight 146 lb (66.2 kg), unknown if currently breastfeeding.  General:  alert, cooperative and no distress   Breasts:  inspection negative, no nipple discharge or bleeding, no masses or nodularity palpable  Lungs: clear to auscultation bilaterally  Heart:  regular rate and rhythm  Abdomen: soft, non-tender; bowel sounds normal; no masses,  no organomegaly   Vulva:  normal  Vagina: normal vagina  Cervix:  not evaluated   Corpus: not examined  Adnexa:  not evaluated  Rectal Exam: Not performed.        Assessment/Plan:  1. Postpartum care and examination  - Normal postpartum exam. Pap smear not done at today's visit. Pap not needed until age 18  - Educated and discussed use of lubrication with intercourse over the next month, encouraged to call office if painful intercourse occurs- patient verbalizes understanding   2. Surveillance for Depo-Provera contraception - Initial Depo given in the hospital prior to discharge home - patient unsure what date it was given (between 12/17 and 12/19)  - next  injection due by March 20th  - medroxyPROGESTERone (DEPO-PROVERA) 150 MG/ML injection; Inject 1 mL (150 mg total) into the muscle every 3 (three) months.  Dispense: 1 mL; Refill: 2  Contraception: Depo-Provera injections Follow up for Depo injection    March 22, CNM 01/09/20, 4:18 PM

## 2020-01-28 IMAGING — US US MFM OB COMP +14 WKS
1 series · 12 of 28 positions shown · non-contrast
Comparison: none

[Series 1: us mfm ob comp +14 wks · 59 acquisitions, 12 frames shown]
[im 3/59]
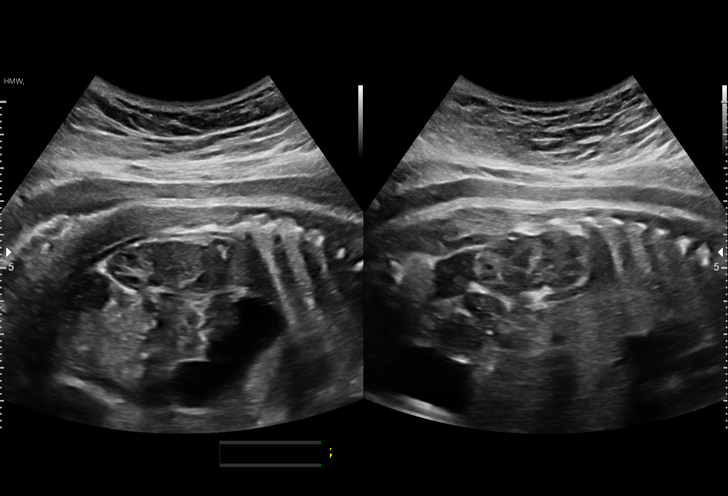
[im 7/59]
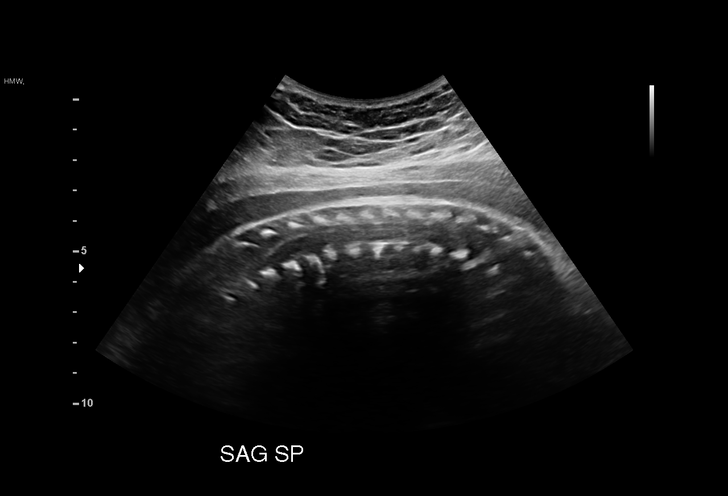
[im 11/59]
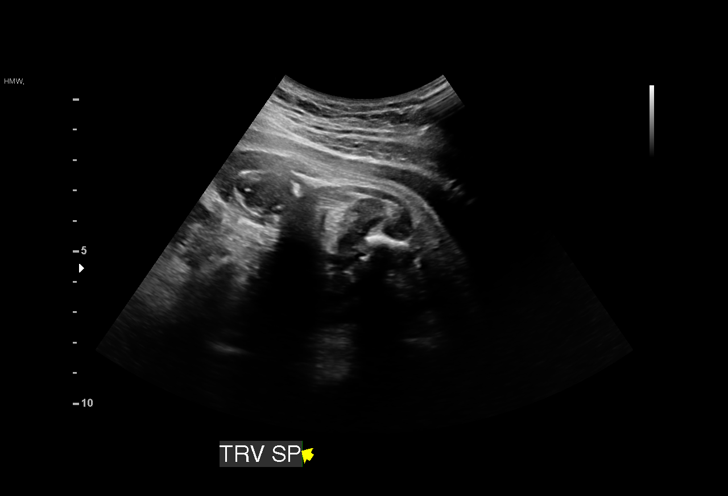
[im 18/59]
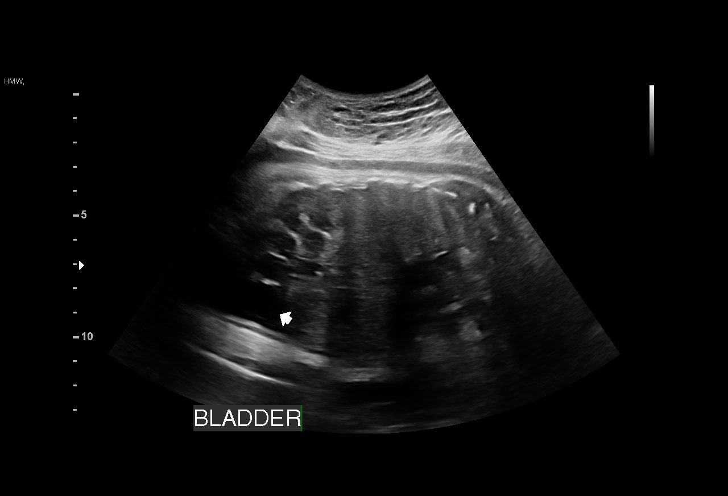
[im 22/59]
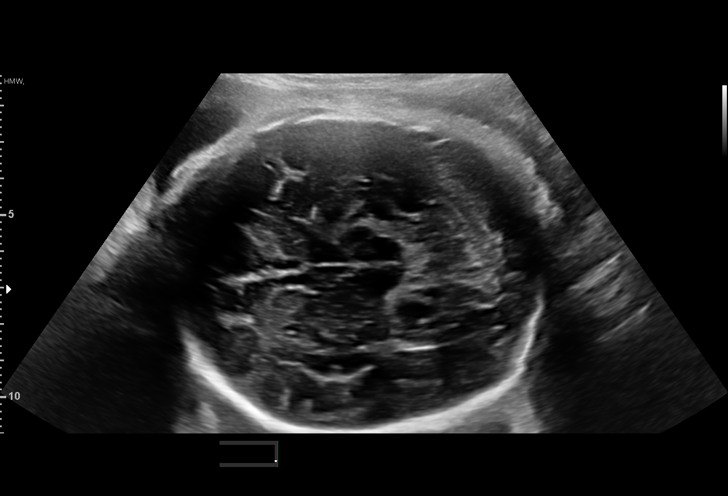
[im 26/59]
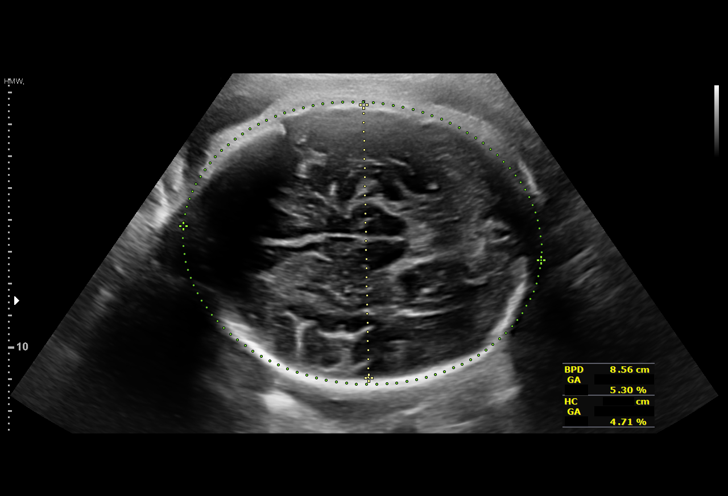
[im 33/59]
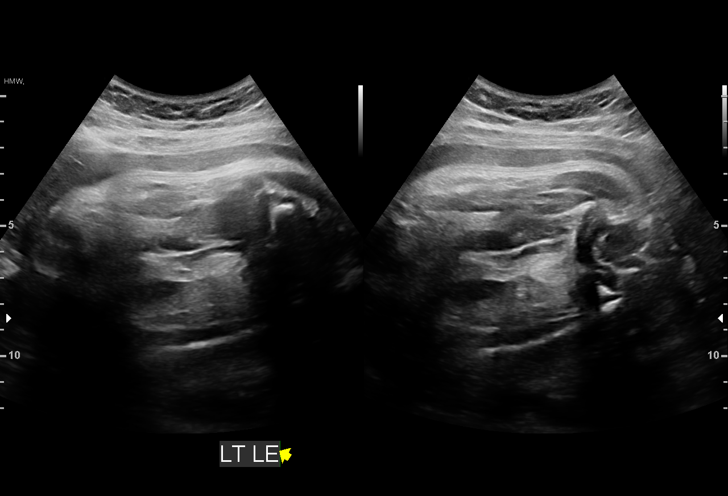
[im 37/59]
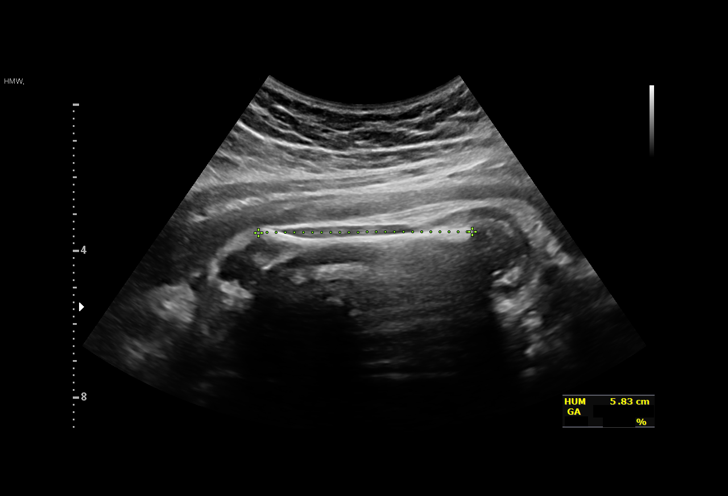
[im 41/59]
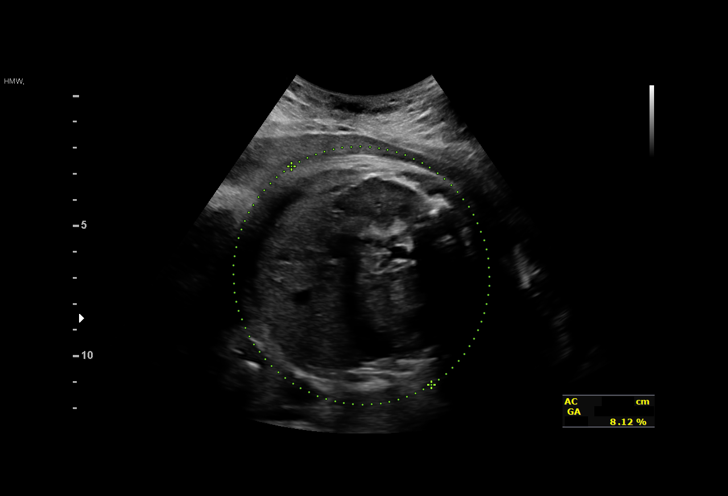
[im 48/59]
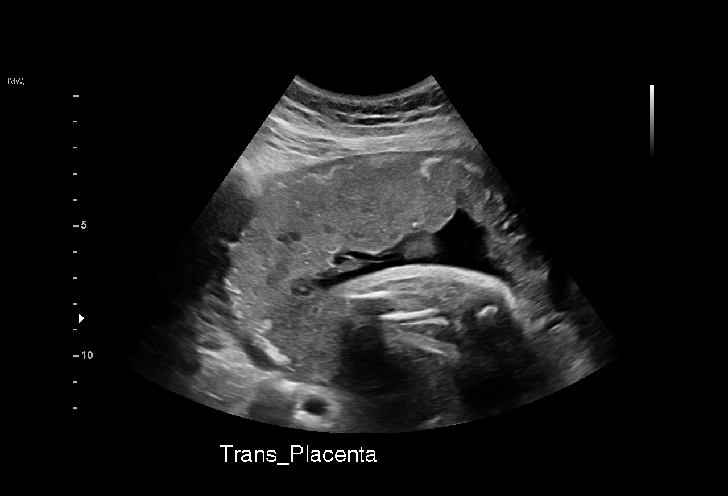
[im 52/59]
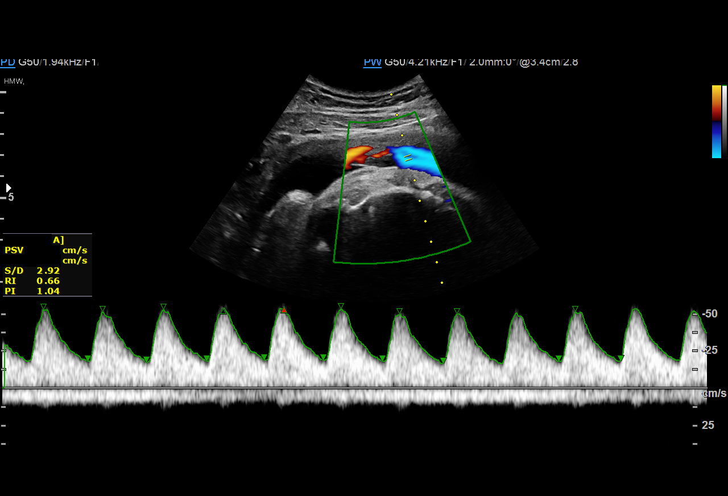
[im 56/59]
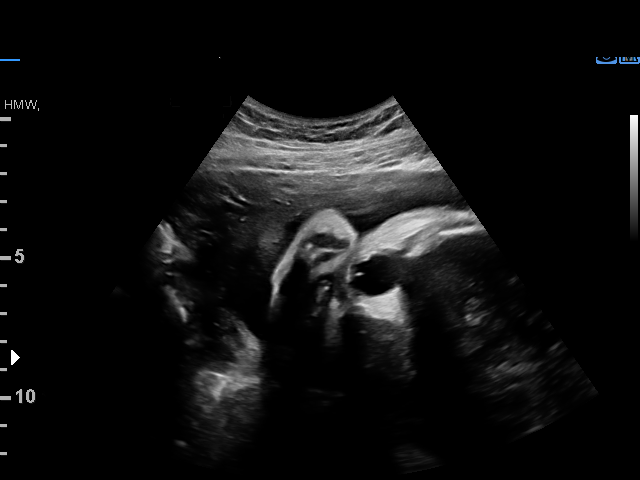

[12 of 28 positions shown; findings below may reference images not displayed]

1  US MFM OB COMP + 14 WK               76805.01     MENTAMER KINGS
  3  US MFM UA CORD DOPPLER               76820.02     MENTAMER KINGS
 ----------------------------------------------------------------------

 ----------------------------------------------------------------------
Indications

  36 weeks gestation of pregnancy
  Encounter for antenatal screening for
  malformations
  Uterine size-date discrepancy, third trimester
  Decreased fetal movements, third trimester,
  unspecified
  Maternal care for known or suspected poor
  fetal growth, third trimester, not applicable or
  unspecified IUGR
 ----------------------------------------------------------------------
Fetal Evaluation

 Num Of Fetuses:         1
 Fetal Heart Rate(bpm):  118
 Cardiac Activity:       Observed
 Presentation:           Cephalic
 Placenta:               Fundal
 P. Cord Insertion:      Visualized, central

 Amniotic Fluid
 AFI FV:      Within normal limits

 AFI Sum(cm)     %Tile       Largest Pocket(cm)
 10.9            30

 RUQ(cm)       RLQ(cm)       LUQ(cm)        LLQ(cm)

Biophysical Evaluation
 Amniotic F.V:   Within normal limits       F. Tone:        Observed
 F. Movement:    Observed                   Score:          [DATE]
 F. Breathing:   Observed
Biometry

 BPD:      86.2  mm     G. Age:  34w 5d         13  %    CI:        73.52   %    70 - 86
                                                         FL/HC:      21.1   %    20.8 -
 HC:      319.4  mm     G. Age:  36w 0d         11  %    HC/AC:      1.04        0.92 -
 AC:      307.1  mm     G. Age:  34w 5d         11  %    FL/BPD:     78.1   %    71 - 87
 FL:       67.3  mm     G. Age:  34w 4d          7  %    FL/AC:      21.9   %    20 - 24
 HUM:      57.9  mm     G. Age:  33w 4d         10  %

 Est. FW:    4885  gm      5 lb 9 oz     12  %
OB History

 Gravidity:    1         Term:   0        Prem:   0        SAB:   0
 TOP:          0       Ectopic:  0        Living: 0
Gestational Age

 LMP:           36w 5d        Date:  03/05/19                 EDD:   12/10/19
 Clinical EDD:  37w 2d                                        EDD:   12/06/19
 U/S Today:     35w 0d                                        EDD:   12/22/19
 Best:          36w 5d     Det. By:  LMP  (03/05/19)          EDD:   12/10/19
Anatomy

 Cranium:               Appears normal         Aortic Arch:            Not well visualized
 Cavum:                 Appears normal         Ductal Arch:            Not well visualized
 Ventricles:            Appears normal         Diaphragm:              Not well visualized
 Choroid Plexus:        Appears normal         Stomach:                Appears normal, left
                                                                       sided
 Cerebellum:            Appears normal         Abdomen:                Appears normal
 Posterior Fossa:       Appears normal         Abdominal Wall:         Not well visualized
 Nuchal Fold:           Not applicable (>20    Cord Vessels:           Appears normal (3
                        wks GA)                                        vessel cord)
 Face:                  Not well visualized    Kidneys:                Appear normal
 Lips:                  Not well visualized    Bladder:                Appears normal
 Thoracic:              Appears normal         Spine:                  Appears normal
 Heart:                 Not well visualized    Upper Extremities:      Visualized
 RVOT:                  Appears normal         Lower Extremities:      Visualized
 LVOT:                  Appears normal

 Other:  Technically difficult due to advanced GA and fetal position.
Doppler - Fetal Vessels

 Umbilical Artery
  S/D     %tile     RI                                     ADFV    RDFV
  3.3       93     0.7                                         N       N

Cervix Uterus Adnexa

 Cervix
 Not visualized (advanced GA >05wks)
 Uterus
 No abnormality visualized.

 Left Ovary
 No adnexal mass visualized.

 Right Ovary
 No adnexal mass visualized.

 Cul De Sac
 No free fluid seen.

 Adnexa
 No abnormality visualized.
Impression

 Patient with suspected fetal growth restriction on clinical
 examination is here for ultrasound assessment.  She recently
 transferred her prenatal care.  Patient showed her" my chart"
 formation of early ultrasound.  On ultrasound performed on
 04/26/2019, the CRL measurement was consistent with 7
 weeks and 3 days gestation.  EDD calculated (12/10/2019)
 from early ultrasound is consistent with her LMP date.  Her
 gestational age is 36w 5d today.

 On ultrasound, amniotic fluid is normal good fetal activity
 seen.  Cephalic presentation.  Estimated fetal weight is at the
 7th percentile.  Abdominal and head circumference
 measurements are at greater than the 10th percentiles.  Fetal
 anatomy appears normal, but limited by advanced gestational
 age.  Antenatal testing is reassuring.  BPP [DATE].  Umbilical
 artery Doppler showed normal forward diastolic flow.

 I explained the findings and also informed her that her
 pregnancy is accurately dated by early ultrasound that is
 consistent with her LMP date.  Ultrasound measurements
 show that the fetus is growth restricted or constitutionally
 small.
 I discussed the timing of delivery and recommended delivery
 between 38 and 39 weeks.  Small for gestational age fetuses
 have a high risk for perinatal morbidity and mortality.

 Patient would like to be delivered at 38 weeks.  She has a
 prenatal appointment on 11/23/2019 and I will be sending a
 message to the provider.
Recommendations

 -Delivery at 38 weeks.
                 Garbi, Karlet

## 2020-02-20 ENCOUNTER — Ambulatory Visit (INDEPENDENT_AMBULATORY_CARE_PROVIDER_SITE_OTHER): Payer: Managed Care, Other (non HMO)

## 2020-02-20 ENCOUNTER — Ambulatory Visit: Payer: Managed Care, Other (non HMO)

## 2020-02-20 ENCOUNTER — Other Ambulatory Visit: Payer: Self-pay

## 2020-02-20 DIAGNOSIS — Z789 Other specified health status: Secondary | ICD-10-CM

## 2020-02-20 DIAGNOSIS — Z3042 Encounter for surveillance of injectable contraceptive: Secondary | ICD-10-CM | POA: Diagnosis not present

## 2020-02-20 MED ORDER — MEDROXYPROGESTERONE ACETATE 150 MG/ML IM SUSP
150.0000 mg | Freq: Once | INTRAMUSCULAR | Status: AC
  Administered 2020-02-20: 150 mg via INTRAMUSCULAR

## 2020-02-20 NOTE — Progress Notes (Signed)
Pt here for Depo Provera injection. Injection tolerated well. Pt will return in 12 weeks.

## 2020-05-07 ENCOUNTER — Ambulatory Visit: Payer: Managed Care, Other (non HMO)

## 2020-05-08 ENCOUNTER — Other Ambulatory Visit: Payer: Self-pay

## 2020-05-08 ENCOUNTER — Ambulatory Visit (INDEPENDENT_AMBULATORY_CARE_PROVIDER_SITE_OTHER): Payer: Managed Care, Other (non HMO) | Admitting: *Deleted

## 2020-05-08 DIAGNOSIS — Z3042 Encounter for surveillance of injectable contraceptive: Secondary | ICD-10-CM | POA: Diagnosis not present

## 2020-05-08 MED ORDER — MEDROXYPROGESTERONE ACETATE 150 MG/ML IM SUSP
150.0000 mg | INTRAMUSCULAR | Status: AC
  Administered 2020-05-08: 150 mg via INTRAMUSCULAR

## 2020-05-08 NOTE — Progress Notes (Signed)
Pt here for Depo Provera injection only.  Pt provides her own meds.  Depo given RUOQ IM

## 2020-05-10 NOTE — Progress Notes (Signed)
Patient was assessed and managed by nursing staff during this encounter. I have reviewed the chart and agree with the documentation and plan.   Sharyon Cable, CNM 05/10/2020 10:36 PM

## 2020-06-06 DIAGNOSIS — Z419 Encounter for procedure for purposes other than remedying health state, unspecified: Secondary | ICD-10-CM | POA: Diagnosis not present

## 2020-07-07 DIAGNOSIS — Z419 Encounter for procedure for purposes other than remedying health state, unspecified: Secondary | ICD-10-CM | POA: Diagnosis not present

## 2020-07-31 ENCOUNTER — Ambulatory Visit: Payer: Managed Care, Other (non HMO)

## 2020-08-07 DIAGNOSIS — Z3042 Encounter for surveillance of injectable contraceptive: Secondary | ICD-10-CM | POA: Diagnosis not present

## 2020-08-07 DIAGNOSIS — Z7289 Other problems related to lifestyle: Secondary | ICD-10-CM | POA: Diagnosis not present

## 2020-08-07 DIAGNOSIS — Z308 Encounter for other contraceptive management: Secondary | ICD-10-CM | POA: Diagnosis not present

## 2020-08-07 DIAGNOSIS — Z419 Encounter for procedure for purposes other than remedying health state, unspecified: Secondary | ICD-10-CM | POA: Diagnosis not present

## 2020-08-29 DIAGNOSIS — Z113 Encounter for screening for infections with a predominantly sexual mode of transmission: Secondary | ICD-10-CM | POA: Diagnosis not present

## 2020-08-29 DIAGNOSIS — M79671 Pain in right foot: Secondary | ICD-10-CM | POA: Diagnosis not present

## 2020-09-06 DIAGNOSIS — Z419 Encounter for procedure for purposes other than remedying health state, unspecified: Secondary | ICD-10-CM | POA: Diagnosis not present

## 2020-09-10 DIAGNOSIS — H5213 Myopia, bilateral: Secondary | ICD-10-CM | POA: Diagnosis not present

## 2020-10-02 DIAGNOSIS — J069 Acute upper respiratory infection, unspecified: Secondary | ICD-10-CM | POA: Diagnosis not present

## 2020-10-07 DIAGNOSIS — Z419 Encounter for procedure for purposes other than remedying health state, unspecified: Secondary | ICD-10-CM | POA: Diagnosis not present

## 2020-10-11 DIAGNOSIS — G8929 Other chronic pain: Secondary | ICD-10-CM | POA: Diagnosis not present

## 2020-10-11 DIAGNOSIS — M25511 Pain in right shoulder: Secondary | ICD-10-CM | POA: Diagnosis not present

## 2020-10-11 DIAGNOSIS — N62 Hypertrophy of breast: Secondary | ICD-10-CM | POA: Diagnosis not present

## 2020-10-11 DIAGNOSIS — Z0000 Encounter for general adult medical examination without abnormal findings: Secondary | ICD-10-CM | POA: Diagnosis not present

## 2020-10-11 DIAGNOSIS — M546 Pain in thoracic spine: Secondary | ICD-10-CM | POA: Diagnosis not present

## 2020-10-11 DIAGNOSIS — M25512 Pain in left shoulder: Secondary | ICD-10-CM | POA: Diagnosis not present

## 2020-11-06 DIAGNOSIS — Z419 Encounter for procedure for purposes other than remedying health state, unspecified: Secondary | ICD-10-CM | POA: Diagnosis not present

## 2020-11-25 DIAGNOSIS — Z3042 Encounter for surveillance of injectable contraceptive: Secondary | ICD-10-CM | POA: Diagnosis not present

## 2020-12-07 DIAGNOSIS — Z419 Encounter for procedure for purposes other than remedying health state, unspecified: Secondary | ICD-10-CM | POA: Diagnosis not present

## 2020-12-10 DIAGNOSIS — R110 Nausea: Secondary | ICD-10-CM | POA: Diagnosis not present

## 2020-12-10 DIAGNOSIS — R059 Cough, unspecified: Secondary | ICD-10-CM | POA: Diagnosis not present

## 2021-01-07 DIAGNOSIS — Z419 Encounter for procedure for purposes other than remedying health state, unspecified: Secondary | ICD-10-CM | POA: Diagnosis not present

## 2021-02-04 DIAGNOSIS — Z419 Encounter for procedure for purposes other than remedying health state, unspecified: Secondary | ICD-10-CM | POA: Diagnosis not present

## 2021-02-12 DIAGNOSIS — Z3042 Encounter for surveillance of injectable contraceptive: Secondary | ICD-10-CM | POA: Diagnosis not present

## 2021-03-06 DIAGNOSIS — Z113 Encounter for screening for infections with a predominantly sexual mode of transmission: Secondary | ICD-10-CM | POA: Diagnosis not present

## 2021-03-06 DIAGNOSIS — Z3042 Encounter for surveillance of injectable contraceptive: Secondary | ICD-10-CM | POA: Diagnosis not present

## 2021-03-06 DIAGNOSIS — N912 Amenorrhea, unspecified: Secondary | ICD-10-CM | POA: Diagnosis not present

## 2021-03-07 DIAGNOSIS — Z419 Encounter for procedure for purposes other than remedying health state, unspecified: Secondary | ICD-10-CM | POA: Diagnosis not present

## 2021-03-31 DIAGNOSIS — Z202 Contact with and (suspected) exposure to infections with a predominantly sexual mode of transmission: Secondary | ICD-10-CM | POA: Diagnosis not present

## 2021-03-31 DIAGNOSIS — A749 Chlamydial infection, unspecified: Secondary | ICD-10-CM | POA: Diagnosis not present

## 2021-04-06 DIAGNOSIS — Z419 Encounter for procedure for purposes other than remedying health state, unspecified: Secondary | ICD-10-CM | POA: Diagnosis not present

## 2021-05-07 DIAGNOSIS — Z419 Encounter for procedure for purposes other than remedying health state, unspecified: Secondary | ICD-10-CM | POA: Diagnosis not present

## 2021-06-06 DIAGNOSIS — Z419 Encounter for procedure for purposes other than remedying health state, unspecified: Secondary | ICD-10-CM | POA: Diagnosis not present

## 2021-07-07 DIAGNOSIS — Z419 Encounter for procedure for purposes other than remedying health state, unspecified: Secondary | ICD-10-CM | POA: Diagnosis not present

## 2021-08-07 DIAGNOSIS — Z419 Encounter for procedure for purposes other than remedying health state, unspecified: Secondary | ICD-10-CM | POA: Diagnosis not present

## 2021-09-06 DIAGNOSIS — Z419 Encounter for procedure for purposes other than remedying health state, unspecified: Secondary | ICD-10-CM | POA: Diagnosis not present

## 2021-10-07 DIAGNOSIS — Z419 Encounter for procedure for purposes other than remedying health state, unspecified: Secondary | ICD-10-CM | POA: Diagnosis not present

## 2021-11-06 DIAGNOSIS — Z419 Encounter for procedure for purposes other than remedying health state, unspecified: Secondary | ICD-10-CM | POA: Diagnosis not present

## 2021-12-07 DIAGNOSIS — Z419 Encounter for procedure for purposes other than remedying health state, unspecified: Secondary | ICD-10-CM | POA: Diagnosis not present

## 2022-01-07 DIAGNOSIS — Z419 Encounter for procedure for purposes other than remedying health state, unspecified: Secondary | ICD-10-CM | POA: Diagnosis not present

## 2022-02-04 DIAGNOSIS — Z419 Encounter for procedure for purposes other than remedying health state, unspecified: Secondary | ICD-10-CM | POA: Diagnosis not present

## 2022-03-07 DIAGNOSIS — Z419 Encounter for procedure for purposes other than remedying health state, unspecified: Secondary | ICD-10-CM | POA: Diagnosis not present

## 2022-04-06 DIAGNOSIS — Z419 Encounter for procedure for purposes other than remedying health state, unspecified: Secondary | ICD-10-CM | POA: Diagnosis not present

## 2022-05-07 DIAGNOSIS — Z419 Encounter for procedure for purposes other than remedying health state, unspecified: Secondary | ICD-10-CM | POA: Diagnosis not present

## 2022-06-06 DIAGNOSIS — Z419 Encounter for procedure for purposes other than remedying health state, unspecified: Secondary | ICD-10-CM | POA: Diagnosis not present

## 2022-07-07 DIAGNOSIS — Z419 Encounter for procedure for purposes other than remedying health state, unspecified: Secondary | ICD-10-CM | POA: Diagnosis not present

## 2022-08-07 DIAGNOSIS — Z419 Encounter for procedure for purposes other than remedying health state, unspecified: Secondary | ICD-10-CM | POA: Diagnosis not present

## 2022-09-06 DIAGNOSIS — Z419 Encounter for procedure for purposes other than remedying health state, unspecified: Secondary | ICD-10-CM | POA: Diagnosis not present

## 2022-10-07 DIAGNOSIS — Z419 Encounter for procedure for purposes other than remedying health state, unspecified: Secondary | ICD-10-CM | POA: Diagnosis not present

## 2022-11-06 DIAGNOSIS — Z419 Encounter for procedure for purposes other than remedying health state, unspecified: Secondary | ICD-10-CM | POA: Diagnosis not present

## 2022-12-07 DIAGNOSIS — Z419 Encounter for procedure for purposes other than remedying health state, unspecified: Secondary | ICD-10-CM | POA: Diagnosis not present

## 2022-12-28 DIAGNOSIS — L209 Atopic dermatitis, unspecified: Secondary | ICD-10-CM | POA: Diagnosis not present

## 2022-12-28 DIAGNOSIS — Z793 Long term (current) use of hormonal contraceptives: Secondary | ICD-10-CM | POA: Diagnosis not present

## 2022-12-28 DIAGNOSIS — N62 Hypertrophy of breast: Secondary | ICD-10-CM | POA: Diagnosis not present

## 2023-01-07 DIAGNOSIS — Z419 Encounter for procedure for purposes other than remedying health state, unspecified: Secondary | ICD-10-CM | POA: Diagnosis not present

## 2023-02-05 DIAGNOSIS — Z419 Encounter for procedure for purposes other than remedying health state, unspecified: Secondary | ICD-10-CM | POA: Diagnosis not present

## 2023-03-08 DIAGNOSIS — Z419 Encounter for procedure for purposes other than remedying health state, unspecified: Secondary | ICD-10-CM | POA: Diagnosis not present

## 2023-03-31 DIAGNOSIS — Z3040 Encounter for surveillance of contraceptives, unspecified: Secondary | ICD-10-CM | POA: Diagnosis not present

## 2023-04-07 DIAGNOSIS — Z419 Encounter for procedure for purposes other than remedying health state, unspecified: Secondary | ICD-10-CM | POA: Diagnosis not present

## 2023-05-08 DIAGNOSIS — Z419 Encounter for procedure for purposes other than remedying health state, unspecified: Secondary | ICD-10-CM | POA: Diagnosis not present

## 2023-06-07 DIAGNOSIS — Z419 Encounter for procedure for purposes other than remedying health state, unspecified: Secondary | ICD-10-CM | POA: Diagnosis not present

## 2023-07-08 DIAGNOSIS — Z419 Encounter for procedure for purposes other than remedying health state, unspecified: Secondary | ICD-10-CM | POA: Diagnosis not present

## 2024-09-04 ENCOUNTER — Encounter

## 2024-09-04 ENCOUNTER — Ambulatory Visit (INDEPENDENT_AMBULATORY_CARE_PROVIDER_SITE_OTHER)

## 2024-09-04 VITALS — BP 114/75 | HR 84

## 2024-09-04 DIAGNOSIS — Z3201 Encounter for pregnancy test, result positive: Secondary | ICD-10-CM | POA: Diagnosis not present

## 2024-09-04 LAB — POCT URINE PREGNANCY: Preg Test, Ur: POSITIVE — AB

## 2024-09-04 NOTE — Progress Notes (Signed)
 Deborah Tate here for a UPT. Pt had a positive upt at home. LMP is 07/03/24.     UPT in office Positive.    Reviewed medications and informed to start a PNV, if not already. Pt to follow up in 1 week for New OB visit.

## 2024-09-05 ENCOUNTER — Other Ambulatory Visit (INDEPENDENT_AMBULATORY_CARE_PROVIDER_SITE_OTHER): Payer: Self-pay

## 2024-09-05 ENCOUNTER — Ambulatory Visit (INDEPENDENT_AMBULATORY_CARE_PROVIDER_SITE_OTHER): Admitting: *Deleted

## 2024-09-05 VITALS — BP 106/70 | HR 82 | Ht 63.0 in | Wt 141.0 lb

## 2024-09-05 DIAGNOSIS — Z23 Encounter for immunization: Secondary | ICD-10-CM

## 2024-09-05 DIAGNOSIS — O3680X1 Pregnancy with inconclusive fetal viability, fetus 1: Secondary | ICD-10-CM | POA: Diagnosis not present

## 2024-09-05 DIAGNOSIS — Z3A01 Less than 8 weeks gestation of pregnancy: Secondary | ICD-10-CM

## 2024-09-05 DIAGNOSIS — Z3480 Encounter for supervision of other normal pregnancy, unspecified trimester: Secondary | ICD-10-CM | POA: Insufficient documentation

## 2024-09-05 DIAGNOSIS — O3680X0 Pregnancy with inconclusive fetal viability, not applicable or unspecified: Secondary | ICD-10-CM

## 2024-09-05 MED ORDER — BLOOD PRESSURE KIT DEVI: 1.0000 | 0 refills | Status: AC

## 2024-09-05 MED ORDER — VITAFOL GUMMIES 3.33-0.333-34.8 MG PO CHEW: 1.0000 | CHEWABLE_TABLET | Freq: Every day | ORAL | 5 refills | Status: AC

## 2024-09-05 NOTE — Progress Notes (Signed)
 OB limited US  shows intrauterine GS=[redacted]w[redacted]d, YS, and possible FP. Plan for repeat US  for viability in 2 weeks.

## 2024-09-05 NOTE — Progress Notes (Signed)
 New OB Intake  I connected with Deborah Tate  on 09/05/24 at  3:10 PM EDT by In Person Visit and verified that I am speaking with the correct person using two identifiers. Nurse is located at [facility] and pt is located at [place].  I discussed the limitations, risks, security and privacy concerns of performing an evaluation and management service by telephone and the availability of in person appointments. I also discussed with the patient that there may be a patient responsible charge related to this service. The patient expressed understanding and agreed to proceed.  I explained I am completing New OB Intake today. We discussed EDD of TBD based on TBD. Pt is [id number]. I reviewed her allergies, medications and Medical/Surgical/OB history.    Patient Active Problem List   Diagnosis Date Noted   Coronavirus infection 10/24/2019     Concerns addressed today  Delivery Plans Plans to deliver at [facility]. Discussed the nature of our practice with multiple providers including residents and students as well as female and female providers. Due to the size of the practice, the delivering provider may not be the same as those providing prenatal care.   Patient is interested in water birth.  MyChart/Babyscripts MyChart access verified. I explained pt will have some visits in office and some virtually. Babyscripts instructions given and order placed. Patient verifies receipt of registration text/e-mail. Account successfully created and app downloaded. If patient is a candidate for Optimized scheduling, add to sticky note.   Blood Pressure Cuff/Weight Scale Blood pressure cuff ordered for patient to pick-up from [employer]. Explained after first prenatal appt pt will check weekly and document in Babyscripts. Patient does not have weight scale; patient may purchase if they desire to track weight weekly in Babyscripts.  Anatomy US  Explained first scheduled US  will be around 19 weeks. Anatomy US   scheduled for TBD at [facility].  Is patient a candidate for Babyscripts Optimization? Yes, patient accepted    First visit review I reviewed new OB appt with patient. Explained pt will be seen by TBD at first visit. Discussed Deborah genetic screening with patient. Requests [employer] and [employer].. Routine prenatal labs: Deferred/ viability not confirmed.  Last Pap No results found for: DIAGPAP  Deborah CHRISTELLA Ober, RN 09/05/2024  4:07 PM

## 2024-09-07 LAB — URINE CULTURE, OB REFLEX

## 2024-09-07 LAB — CULTURE, OB URINE

## 2024-09-08 ENCOUNTER — Ambulatory Visit: Payer: Self-pay | Admitting: Obstetrics and Gynecology

## 2024-09-11 NOTE — Addendum Note (Signed)
 Addended by: Vandana Haman M on: 09/11/2024 05:15 PM   Modules accepted: Orders

## 2024-09-18 ENCOUNTER — Encounter
